# Patient Record
Sex: Female | Born: 1945 | Race: White | Hispanic: No | Marital: Married | Smoking: Never smoker
Health system: Southern US, Community
[De-identification: ages and names within clinical notes are randomized; demographics above are authoritative.]

## PROBLEM LIST (undated history)

## (undated) DIAGNOSIS — K269 Duodenal ulcer, unspecified as acute or chronic, without hemorrhage or perforation: Secondary | ICD-10-CM

## (undated) DIAGNOSIS — M533 Sacrococcygeal disorders, not elsewhere classified: Secondary | ICD-10-CM

## (undated) DIAGNOSIS — E7212 Methylenetetrahydrofolate reductase deficiency: Secondary | ICD-10-CM

## (undated) DIAGNOSIS — B9681 Helicobacter pylori [H. pylori] as the cause of diseases classified elsewhere: Secondary | ICD-10-CM

## (undated) DIAGNOSIS — M5481 Occipital neuralgia: Secondary | ICD-10-CM

## (undated) DIAGNOSIS — K279 Peptic ulcer, site unspecified, unspecified as acute or chronic, without hemorrhage or perforation: Secondary | ICD-10-CM

## (undated) DIAGNOSIS — Z1589 Genetic susceptibility to other disease: Secondary | ICD-10-CM

## (undated) DIAGNOSIS — G894 Chronic pain syndrome: Secondary | ICD-10-CM

## (undated) DIAGNOSIS — K219 Gastro-esophageal reflux disease without esophagitis: Secondary | ICD-10-CM

## (undated) DIAGNOSIS — G629 Polyneuropathy, unspecified: Secondary | ICD-10-CM

## (undated) DIAGNOSIS — Q782 Osteopetrosis: Secondary | ICD-10-CM

## (undated) DIAGNOSIS — G4700 Insomnia, unspecified: Secondary | ICD-10-CM

## (undated) HISTORY — DX: Insomnia, unspecified: G47.00

## (undated) HISTORY — DX: Sacrococcygeal disorders, not elsewhere classified: M53.3

## (undated) HISTORY — PX: OTHER SURGICAL HISTORY: SHX169

## (undated) HISTORY — DX: Genetic susceptibility to other disease: Z15.89

## (undated) HISTORY — PX: CAUTERIZE INNER NOSE: SHX279

## (undated) HISTORY — DX: Duodenal ulcer, unspecified as acute or chronic, without hemorrhage or perforation: K26.9

## (undated) HISTORY — DX: Methylenetetrahydrofolate reductase deficiency: E72.12

## (undated) HISTORY — DX: Chronic pain syndrome: G89.4

## (undated) HISTORY — DX: Occipital neuralgia: M54.81

## (undated) HISTORY — DX: Osteopetrosis: Q78.2

## (undated) HISTORY — DX: Gastro-esophageal reflux disease without esophagitis: K21.9

---

## 1985-02-23 HISTORY — PX: KNEE SURGERY: SHX244

## 2010-05-20 ENCOUNTER — Inpatient Hospital Stay (INDEPENDENT_AMBULATORY_CARE_PROVIDER_SITE_OTHER)
Admission: RE | Admit: 2010-05-20 | Discharge: 2010-05-20 | Disposition: A | Discharge status: Home / Self Care | Payer: 59 | Source: Ambulatory Visit | Attending: Family Medicine | Admitting: Family Medicine

## 2010-05-20 ENCOUNTER — Encounter: Payer: Self-pay | Admitting: Family Medicine

## 2010-05-20 DIAGNOSIS — R51 Headache: Secondary | ICD-10-CM

## 2010-05-20 DIAGNOSIS — R519 Headache, unspecified: Secondary | ICD-10-CM | POA: Insufficient documentation

## 2010-05-21 ENCOUNTER — Telehealth (INDEPENDENT_AMBULATORY_CARE_PROVIDER_SITE_OTHER): Payer: Self-pay | Admitting: *Deleted

## 2010-05-22 ENCOUNTER — Encounter: Payer: Self-pay | Admitting: Family Medicine

## 2010-05-26 ENCOUNTER — Telehealth (INDEPENDENT_AMBULATORY_CARE_PROVIDER_SITE_OTHER): Payer: Self-pay | Admitting: *Deleted

## 2010-05-27 NOTE — Assessment & Plan Note (Signed)
Summary: PAIN IN LEFT EAR/SWOLLEN CHEEK rm 4   Vital Signs:  Patient Profile:   65 Years Old Female CC:      LT ear pain Height:     64.75 inches Weight:      137 pounds O2 Sat:      97 % O2 treatment:    Room Air Temp:     98.2 degrees F oral Pulse rate:   78 / minute Resp:     16 per minute BP sitting:   115 / 73  (left arm) Cuff size:   large  Vitals Entered By: Clemens Catholic LPN (May 20, 2010 4:05 PM)                  Updated Prior Medication List: ZOLPIDEM TARTRATE 10 MG TABS (ZOLPIDEM TARTRATE)  NUCYNTA 50 MG TABS (TAPENTADOL HCL)   Current Allergies: ! * DUST AND POLLENHistory of Present Illness Chief Complaint: LT ear pain History of Present Illness:  Subjective:  Patient complains of complains of mild swelling and pain in her left cheek and jaw area for 2 days.   She had a similar problem in July, 2011, after having a root canal on the left lower jaw.  She was treated with several rounds of amoxicillin and swelling at that time finally resolved.  Since then she has had recurring episodes of pain in the left jaw and ear area, but the pain does not go into her left temporal area.  The pain is somewhat worse at night.  Mild sinus congestion.  She had a CT scan of her head in September, 2011, that was negative.  She has been evaluated by ENT and dentist recently who do not feel that she has TMJ pain.  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  REVIEW OF SYSTEMS Constitutional Symptoms      Denies fever, chills, night sweats, weight loss, weight gain, and fatigue.  Eyes       Denies change in vision, eye pain, eye discharge, glasses, contact lenses, and eye surgery. Ear/Nose/Throat/Mouth       Complains of ear pain and sinus problems.      Denies hearing loss/aids, change in hearing, ear discharge, dizziness, frequent runny nose, frequent nose bleeds, sore throat, hoarseness, and tooth pain or  bleeding.  Respiratory       Denies dry cough, productive cough, wheezing, shortness of breath, asthma, bronchitis, and emphysema/COPD.  Cardiovascular       Denies murmurs, chest pain, and tires easily with exhertion.    Gastrointestinal       Denies stomach pain, nausea/vomiting, diarrhea, constipation, blood in bowel movements, and indigestion. Genitourniary       Denies painful urination, kidney stones, and loss of urinary control. Neurological       Denies paralysis, seizures, and fainting/blackouts. Musculoskeletal       Denies muscle pain, joint pain, joint stiffness, decreased range of motion, redness, swelling, muscle weakness, and gout.  Skin       Denies bruising, unusual mles/lumps or sores, and hair/skin or nail changes.  Psych       Denies mood changes, temper/anger issues, anxiety/stress, speech problems, depression, and sleep problems. Other Comments: pt c/o LT ear pain and swelling on the LT side of her face x . she states that she had a root canal in july 2011 and has had sinus problems since then. she was  treated with 2 mths of ABT then had a CT head, negative per pt for tumor, sinuses clear. the pain is worse at night. she has tried heat packs with no help. she states that her dentist checked her for TMJ and that was negative as well. 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  Extraocular movement is intact.  Conjunctivae are not inflamed.  Ears:  Canals  normal.  Tympanic membranes normal.  No TMJ tenderness Nose:  Mildly congested.  Mild tenderness with deep palpation over the left maxillary sinus. Face:  No obvious swelling of left face.  No erythema or warmth.  Left cheek slightly tender to palpation Mouth:  No lesions.  Teeth in good repair.  No gingival tenderness Pharynx:  Normal  Neck:  Supple.  No adenopathy is present.  No thyromegaly is present  Lungs:  Clear to auscultation.  Breath sounds are equal.  Heart:  Regular rate and rhythm without murmurs, rubs, or gallops.  Skin:  No rash Neuroologic:  Cranial nerves intact CBC:  WBC 5.9; 39.5 LY, 2.4 MO, 58.1 GR; Hgb 14.6 Assessment New Problems: FACIAL PAIN (ICD-784.0)  LEFT FACIAL PAIN ? ETIOLOGY.  ? SINUSITIS.  NO TENDERNESS OVER TMJ OR TEMPORAL ARTERY ? NEUROPATHIC PAIN  Plan New Medications/Changes: VOLTAREN 1 % GEL (DICLOFENAC SODIUM) Apply one gm to affected area two times a day to three times a day  #100gm x 0, 05/20/2010, Donna Christen MD AMOXICILLIN 875 MG TABS (AMOXICILLIN) One by mouth two times a day  #20 x 0, 05/20/2010, Donna Christen MD  New Orders: New Patient Level IV [99204] CBC w/Diff [16109-60454] T-Sed Rate (Automated) [09811-91478] Planning Comments:   Will check sed rate.  Begin empiric amoxicillin for possible sinusitis.  Trial of Voltaren gel (patient declines prednisone) If symptoms do not improve, suggest neurologic evaluation.   The patient and/or caregiver has been counseled thoroughly with regard to medications prescribed including dosage, schedule, interactions, rationale for use, and possible side effects and they verbalize understanding.  Diagnoses and expected course of recovery discussed and will return if not improved as expected or if the condition worsens. Patient and/or caregiver verbalized understanding.  Prescriptions: VOLTAREN 1 % GEL (DICLOFENAC SODIUM) Apply one gm to affected area two times a day to three times a day  #100gm x 0    Entered and Authorized by:   Donna Christen MD   Signed by:   Donna Christen MD on 05/20/2010   Method used:   Print then Give to Patient   RxID:   [id number] AMOXICILLIN 875 MG TABS (AMOXICILLIN) One by mouth two times a day  #20 x 0   Entered and Authorized by:   Donna Christen MD   Signed by:   Donna Christen MD on 05/20/2010   Method used:   Print then Give to Patient   RxID:   [id number]   Orders Added: 1)  New Patient Level IV [72536] 2)  CBC w/Diff [64403-47425]  3)  T-Sed Rate (Automated) [16109-60454]

## 2010-05-27 NOTE — Progress Notes (Signed)
  Phone Note Call from Patient   Caller: Patient Call For: medication reaction Summary of Call: pt called and states that she took Amoxicillin last night about 6:30pm and then she took her other 2 meds about 2 hours later. During the night she felt SOB and like her skin was "crawling". she has taken amoxicillin in the past with no problems. she has taken keflex and zpak in the past with no problems. advised her not to take anymore until we called her back. she uses Adult nurse in [place]. pts phone # [phone]. 05/21/10 10:30AM - per Dr Orson Aloe have the pt to take Claritin and Sudafed, stop Amoxicillin. call back on friday if her sinus s/s are not improving or worsen and dr Orson Aloe will call in Zpak.pt agrees/ C.Vidalia Serpas,LPN Initial call taken by: Clemens Catholic LPN,  May 21, 2010 10:09 AM  Follow-up for Phone Call        Stop Amoxicillin.  Use OTC Zytec-D for a few days, hydration, nasal saline.  Her ESR is normal.  If not improving in a few days, she can call back and we will call in a Zpak for her... vs seeing an ENT. Follow-up by: Hoyt Koch MD,  May 21, 2010 10:31 AM

## 2010-06-02 ENCOUNTER — Telehealth (INDEPENDENT_AMBULATORY_CARE_PROVIDER_SITE_OTHER): Payer: Self-pay | Admitting: *Deleted

## 2010-06-14 ENCOUNTER — Inpatient Hospital Stay (INDEPENDENT_AMBULATORY_CARE_PROVIDER_SITE_OTHER)
Admission: RE | Admit: 2010-06-14 | Discharge: 2010-06-14 | Disposition: A | Discharge status: Home / Self Care | Payer: 59 | Source: Ambulatory Visit | Attending: Emergency Medicine | Admitting: Emergency Medicine

## 2010-06-14 ENCOUNTER — Encounter: Payer: Self-pay | Admitting: Emergency Medicine

## 2010-06-14 DIAGNOSIS — R1013 Epigastric pain: Secondary | ICD-10-CM | POA: Insufficient documentation

## 2010-06-18 ENCOUNTER — Telehealth (INDEPENDENT_AMBULATORY_CARE_PROVIDER_SITE_OTHER): Payer: Self-pay | Admitting: *Deleted

## 2010-07-06 ENCOUNTER — Encounter: Payer: Self-pay | Admitting: Family Medicine

## 2010-07-10 ENCOUNTER — Ambulatory Visit: Payer: 59 | Admitting: Family Medicine

## 2011-01-26 NOTE — Progress Notes (Signed)
Summary: STOMACH ACHE, BURNING IN STOMACH UP TO THROAT,CHEST PAIN, LT(rm5   Vital Signs:  Patient Profile:   65 Years Old Female CC:      Abdominal Pain O2 treatment:    Room Air (right arm) Cuff size:   regular  Vitals Entered By: Linton Flemings RN (June 14, 2010 1:19 PM)                  Updated Prior Medication List: ZOLPIDEM TARTRATE 10 MG TABS (ZOLPIDEM TARTRATE)  NUCYNTA 50 MG TABS (TAPENTADOL HCL)  AMOXICILLIN 875 MG TABS (AMOXICILLIN) One by mouth two times a day VOLTAREN 1 % GEL (DICLOFENAC SODIUM) Apply one gm to affected area two times a day to three times a day  Current Allergies: ! * DUST AND POLLEN ! SULFAHistory of Present Illness History from: patient and husband Chief Complaint: Abdominal Pain History of Present Illness: Epigastric pain, "feels like burning and esophageal spasm" in character, intermittently for 10 days. She feels it was triggered by eating pineapple and drinking coffee. Buttemilk helped a little. PeptoBismal helped.  3 days ago, tarted otc Prilosec, 20 mg daily, and she feels that is helping some. Pain started as 10/10 three days ago, now is 4/10. Denies actual chest pain, dyspnea, n , vom, or change of bowel habits.  Symptoms Trauma: Denies Diaphoresis: No Shortness of breath: No Cough: No Edema: No Orthopnea: No Syncope: No + Indigestion. Rarely feeling it in r and l shoulder. No radiation to arms. No numbness or tingling. Symptoms NOT Worse with exertion:   NoTearing/radiation to back:     REVIEW OF SYSTEMS Constitutional Symptoms      Denies fever, chills, night sweats, weight loss, weight gain, and fatigue.  Eyes       Denies change in vision, eye pain, eye discharge, glasses, contact lenses, and eye surgery. Ear/Nose/Throat/Mouth       Denies hearing loss/aids, change in hearing, ear pain, ear discharge, dizziness, frequent runny nose, frequent nose bleeds, sinus problems, sore throat, hoarseness, and tooth pain or bleeding.   Respiratory       Denies dry cough, productive cough, wheezing, shortness of breath, asthma, bronchitis, and emphysema/COPD.  Cardiovascular       Complains of chest pain.      Denies murmurs and tires easily with exhertion.    Gastrointestinal       Complains of stomach pain.      Denies nausea/vomiting, diarrhea, constipation, blood in bowel movements, and indigestion. Genitourniary       Denies painful urination, kidney stones, and loss of urinary control. Neurological       Denies paralysis, seizures, and fainting/blackouts. Musculoskeletal       Denies muscle pain, joint pain, joint stiffness, decreased range of motion, redness, swelling, muscle weakness, and gout.  Skin       Denies bruising, unusual mles/lumps or sores, and hair/skin or nail changes.  Psych       Denies mood changes, temper/anger issues, anxiety/stress, speech problems, depression, and sleep problems. Other Comments: states two days ago started with epigastric abd pain which radiates up to chest, left shoulder pain and back pain.  states pain comes and goes and she has alot of buring in abdomen.  denies SHOB, dizziness, blurred.    Past History:  Past Medical History: Reviewed history from 05/20/2010 and no changes required. 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 tail bone removed  1965  Family History: Reviewed history from 05/20/2010 and no changes required. mother deceased age 39 father deceased age 104- throat CA, V-tach  Social History: Reviewed history from 05/20/2010 and no changes required. Never Smoked Alcohol use-yes wine occasionally Drug use-no Physical Exam General appearance: Pleasant,well developed, well nourished, no acute distress. Mildly anxious. Head: normocephalic, atraumatic Eyes: conjunctivae and lids normal Pupils: equal, round, reactive to light Ears: normal, no lesions or  deformities Nasal: mucosa pink, nonedematous, no septal deviation, turbinates normal Oral/Pharynx: tongue normal, posterior pharynx without erythema or exudate Neck: neck supple,  trachea midline, no masses. Nontender. Chest/Lungs: no rales, wheezes, or rhonchi bilateral, breath sounds equal without effort. Minimal sternal ttp. Heart: regular rate and  rhythm, no murmur Abdomen: positive mild  epigastric tenderness, abdomen soft without obvious organomegaly. No other ttp. No masses, guarding, or rebound. Extremities: normal extremities. No cce. No calf ttp. Neg Homans sign. Neurological: grossly intact and non-focal Skin: no obvious rashes or lesions EKG today: NSR. WNL. Assessment New Problems: ABDOMINAL PAIN, EPIGASTRIC (ICD-789.06)  Likely gastritis and/or esoph.reflux and spasm. EKG today wnl.  No evidence of any acute cardioresp event.  Patient Education: Demonstrates willingness to comply.  Plan New Orders: Est. Patient Level IV [16109] EKG w/ Interpretation [93000] Services provided After hours-Weekends-Holidays [99051] Planning Comments:   Discussed at length with pt and husband. They decline any other w/u or referral. Tx with increased dose of Prilosec to 40 mg q day for 10 days. Anti-reflux and dietary measures discussed at length.Risks, benefits, alternatives discussed. Pt voiced understanding and agreement. ---Pt had numerous other issues, and we discussed briefly, but I advised she f/u with her general internist, who is her PCP. If any severe, worsening, or new sxs, go to ER stat. Pt and husband voiced understanding and agreement.  Follow Up: Follow up with Primary Physician  The patient and/or caregiver has been counseled thoroughly with regard to medications prescribed including dosage, schedule, interactions, rationale for use, and possible side effects and they verbalize understanding.  Diagnoses and expected course of recovery discussed and will return if not improved  as expected or if the condition worsens. Patient and/or caregiver verbalized understanding.   Orders Added: 1)  Est. Patient Level IV [60454] 2)  EKG w/ Interpretation [93000] 3)  Services provided After hours-Weekends-Holidays [99051]

## 2011-01-26 NOTE — Telephone Encounter (Signed)
  Phone Note Call from Patient   Caller: Patient Call For: rx Summary of Call: pt called and states that she has been taking Claritin and she has not improved. per dr Orson Aloe ok to call in Zpak take as directed #1/ 0 refills to [facility] pharmacy. left message for pt that the Zpak had been called in and if she fails to improve after taking it she will need to F/U with an ENT. Initial call taken by: Clemens Catholic LPN,  May 26, 2010 11:12 AM

## 2011-01-26 NOTE — Telephone Encounter (Signed)
  Phone Note Outgoing Call   Call placed by: Clemens Catholic LPN,  June 18, 2010 9:10 AM Call placed to: Patient Summary of Call: call back: pt states that she is feeling much better. advised her to call back with any questions or concerns. Initial call taken by: Clemens Catholic LPN,  June 18, 2010 9:10 AM

## 2011-01-26 NOTE — Telephone Encounter (Signed)
  Phone Note Call from Patient   Caller: Patient Summary of Call: pt called and states that she is some better, she still has jaw pain. advised her to follow up with her ENT and to sch appt with a PCP. pt agrees. Initial call taken by: Clemens Catholic LPN,  June 02, 2010 11:05 AM

## 2015-02-24 DIAGNOSIS — B9681 Helicobacter pylori [H. pylori] as the cause of diseases classified elsewhere: Secondary | ICD-10-CM

## 2015-02-24 DIAGNOSIS — K279 Peptic ulcer, site unspecified, unspecified as acute or chronic, without hemorrhage or perforation: Secondary | ICD-10-CM

## 2015-02-24 HISTORY — DX: Helicobacter pylori (H. pylori) as the cause of diseases classified elsewhere: B96.81

## 2015-02-24 HISTORY — DX: Peptic ulcer, site unspecified, unspecified as acute or chronic, without hemorrhage or perforation: K27.9

## 2015-11-06 ENCOUNTER — Ambulatory Visit (INDEPENDENT_AMBULATORY_CARE_PROVIDER_SITE_OTHER): Payer: Medicare HMO | Admitting: Family Medicine

## 2015-11-06 ENCOUNTER — Encounter: Payer: Self-pay | Admitting: Family Medicine

## 2015-11-06 VITALS — BP 127/79 | HR 72 | Wt 142.0 lb

## 2015-11-06 DIAGNOSIS — M5481 Occipital neuralgia: Secondary | ICD-10-CM | POA: Insufficient documentation

## 2015-11-06 DIAGNOSIS — M720 Palmar fascial fibromatosis [Dupuytren]: Secondary | ICD-10-CM

## 2015-11-06 MED ORDER — AMBULATORY NON FORMULARY MEDICATION: 0 refills | Status: DC

## 2015-11-06 NOTE — Progress Notes (Signed)
Gina Mccall is a 70 y.o. female who presents to [facility] today for right hand nodule and neck pain.  Patient has a hand nodule on her right hand present for months. She notes on the palmar aspect of the ulnar side of her hand she has a nodule that has contracted the palm side of her hand. She is able to extend her hand fully denies any lack of grip strength. She does note some pain with hand motion. No radiating pain weakness or numbness. She denies any injury. She has not had any formal treatment yet.  Additionally she notes continued neck pain. She has a history of occipital neuralgia and has received some massage therapy in the past. She is interested in formal physical therapy.   Past Medical History:  Diagnosis Date  . 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 Tail bone pain tail bone removed 1965    Past Surgical History:  Procedure Laterality Date  . tail bone  removed 1965   Social History  Substance Use Topics  . Smoking status: Never Smoker  . Smokeless tobacco: Not on file  . Alcohol use Yes     Comment: occasional glass of wine   family history includes Cancer in her father; Heart disease in her father.  ROS:  No headache, visual changes, nausea, vomiting, diarrhea, constipation, dizziness, abdominal pain, skin rash, fevers, chills, night sweats, weight loss, swollen lymph nodes, body aches, joint swelling, muscle aches, chest pain, shortness of breath, mood changes, visual or auditory hallucinations.    Medications: Current Outpatient Prescriptions  Medication Sig Dispense Refill  . amoxicillin (AMOXIL) 875 MG tablet Take 875 mg by mouth 2 (two) times daily.      . Calcium-Magnesium-Vitamin D 500-250-200 MG-MG-UNIT TABS Take by mouth.    . Cholecalciferol (VITAMIN D3) 5000 units TABS Take by mouth.    . clonazePAM (KLONOPIN) 0.5 MG tablet TAKE 1 TABLET (0.5 MG TOTAL) BY MOUTH AT BEDTIME AS NEEDED FOR ANXIETY.    .  clonazePAM (KLONOPIN) 0.5 MG tablet TAKE 1.5  TABLET (0.75 MG TOTAL) BY MOUTH AT BEDTIME AS NEEDED FOR ANXIETY.    . diclofenac sodium (VOLTAREN) 1 % GEL Apply 1 application topically. Twice daily     . estradiol (CLIMARA - DOSED IN MG/24 HR) 0.05 mg/24hr patch     . L-Methylfolate-Algae-B12-B6 (METANX PO) Take by mouth.    . Omega-3 Fatty Acids (FISH OIL) 1000 MG CAPS Take by mouth.    . Probiotic Product (ACIDOPHILUS/GOAT MILK) CAPS Take by mouth.    . tapentadol (NUCYNTA) 50 MG TABS Take 50 mg by mouth.      . vitamin E 100 UNIT capsule Take by mouth.    . zolpidem (AMBIEN) 10 MG tablet Take 10 mg by mouth.      . AMBULATORY NON FORMULARY MEDICATION Massage therapy for occipital neuralgia. Use as needed. 1 each 0   No current facility-administered medications for this visit.    Allergies  Allergen Reactions  . Sulfonamide Derivatives      Exam:  BP 127/79   Pulse 72   Wt 142 lb (64.4 kg)   BMI 23.81 kg/m  General: Well Developed, well nourished, and in no acute distress.  Neuro/Psych: Alert and oriented x3, extra-ocular muscles intact, able to move all 4 extremities, sensation grossly intact. Skin: Warm and dry, no rashes noted.  Respiratory: Not using accessory muscles, speaking in full sentences, trachea midline.  Cardiovascular: Pulses palpable, no extremity  edema. Abdomen: Does not appear distended. MSK: Right hand: Superficial flexion overlying the palmar aspect of the fourth metacarpal. No fifth metacarpal involvement. Appearance is consistent with Dupuytren's contracture. Neck: Nontender normal neck motion.   No results found for this or any previous visit (from the past 24 hour(s)). No results found.   Assessment and Plan: 70 y.o. female with  Right hand: Dupuytren's contracture. Discussed options. Patient elects for trial of hand therapy.  Occipital neuralgia: Trial of formal physical therapy and massage therapy. Return as needed.   Discussed warning signs  or symptoms. Please see discharge instructions. Patient expresses understanding.

## 2015-11-06 NOTE — Patient Instructions (Signed)
Thank you for coming in today. Do the stretching and eccentric exercises for the hand.  Attend PT for the neck.  Return as needed.    Dupuytren's Contracture Dupuytren's contracture affects the fingers and the palm of the hand. This condition usually develops slowly. It may take many years to develop. The pinky finger and the ring finger are most often affected. These fingers start to curve inward, like a claw. At some point, the fingers cannot go straight anymore. This can make it hard to do things like:  Put on gloves.  Shake hands.  Grab something off a shelf. The condition usually does not cause pain and is not dangerous. The condition gets its name from the doctor who came up with an operation to fix the problem. His name was Parke PoissonBaron Guillaume Dupuytren. Contracture means pulling inward. CAUSES  Dupuytren's contracture does not start with the fingers. It starts in the palm of the hand, under the skin. The tissue under the skin is called fascia. The fascia covers the cords (tendons) that control how the fingers move. In Dupuytren's contracture the fascia tissue becomes thick and then pulls on the cords. That causes the fingers to curl. The condition can affect both hands and any fingers, but it usually strikes one hand worse than the other. The fingers farthest from the thumb are most often the ones that curl. The cause is not clear. Some experts believe it results from an autoimmune reaction. That means the body's immune system (which fights off disease) attacks itself by mistake. What experts do know is that certain conditions and behaviors (called risk factors) make the chance of having this condition more likely. They include:  Age. Most people who have the condition are older than 50.  Sex. It affects men more often than women.  Family history. The condition tends to run in families from countries in [place] and [place].  Certain behaviors. People who smoke and drink  alcohol are more apt to develop the problem.  Some other medical conditions. Having diabetes makes Dupuytren's contracture more likely. So does having a condition that involves a seizure (when the brain's function is interrupted). SYMPTOMS  Signs of this condition take time to develop. Sometimes this takes weeks or months. More often, it takes several years.   Early symptoms:  Skin on the palm of the hand becomes thick. This is usually the first sign.  The skin may look dimpled or puckered.  Lumps (nodules) show up on the palm. There may be one or more lumps. They are not painful.  Later symptoms:  Thick cords of tissue form in the palm of the hand.  The pinky and ring fingers start to curl up into the palm.  The fingers cannot be straightened into their normal position. DIAGNOSIS  A physical examination is the main way that a healthcare provider can tell if you have Dupuytren's contracture. Other tests usually are not needed. The caregiver will probably:  Look at your hands. Feel your hands. This is to check for thickening and nodules.  Measure finger motion. This tells how much your fingers have contracted (pulled in).  Do a tabletop test. You will be asked to try to put your hand flat on a table, palm down. TREATMENT  There is no cure for Dupuytren's contracture. But there are ways to treat the symptoms. Options include:  Watching and waiting. The condition develops slowly. Often it does not create problems for a long time. Sometimes the skin gets thick  and nodules form, but the fingers never curl. So, in some cases it is best to just watch the condition carefully and wait to see what happens.  Shots (injections). Different substances may be injected, including:  Steroids. These drugs block swelling. These shots should make the condition less uncomfortable. Steroids may also slow down the condition. Shots are given into the nodules. The effect only lasts awhile. More shots may  have to be given.  Enzymes. These are proteins. They weaken the thick tissue. After an injection, the caregiver usually stretches the fingers.  Needling. A needle is pushed through the skin and into the thick tissue. This is done in several spots. The goal is to break up the thickened tissue. Or to weaken it.  Surgery. This may be suggested if you cannot grasp objects. Or, if you can no longer put your hand in your pocket.  A cut (incision) is made in the palm of the hand. The thick tissue is removed.  Sometimes the thick tissue is attached to the skin. Then, the skin must be removed, too. It is replaced with a piece of skin from another place on your body. That is called a skin graft.  Occupational or hand therapy is almost always needed after surgery. This involves special exercises to get back the use of your hand and fingers. After a skin graft, several months of therapy may be needed.  Sometimes the condition comes back, even after surgery.  Other methods. You can do some things on your own. They include:  Stretching the fingers backwards. Do this often.  Warming the hand and massaging it. Again, do this often.  Using tools with padded grips. This should make things easier.  Wearing heavy gloves while working. This protects the hands. PROGNOSIS  Dupuytren's contracture usually develops slowly. There is no cure. But, the symptoms can be treated. Sometimes they come back after treatment, but not always. It is important to remember that this is a functional problem and not a life-threatening condition.   This information is not intended to replace advice given to you by your health care provider. Make sure you discuss any questions you have with your health care provider.   Document Released: 12/07/2008 Document Revised: 03/02/2014 Document Reviewed: 12/07/2008 Elsevier Interactive Patient Education [employer]

## 2017-03-18 ENCOUNTER — Ambulatory Visit (INDEPENDENT_AMBULATORY_CARE_PROVIDER_SITE_OTHER): Payer: Medicare HMO | Admitting: Obstetrics & Gynecology

## 2017-03-18 ENCOUNTER — Ambulatory Visit: Payer: Medicare HMO | Admitting: Obstetrics & Gynecology

## 2017-03-18 ENCOUNTER — Encounter: Payer: Self-pay | Admitting: Obstetrics & Gynecology

## 2017-03-18 VITALS — BP 114/74 | HR 70 | Ht 65.0 in | Wt 132.0 lb

## 2017-03-18 DIAGNOSIS — Z01419 Encounter for gynecological examination (general) (routine) without abnormal findings: Secondary | ICD-10-CM

## 2017-03-18 MED ORDER — ALENDRONATE SODIUM 70 MG PO TABS: 70.0000 mg | ORAL_TABLET | ORAL | 12 refills | Status: DC

## 2017-03-18 MED ORDER — ESTRADIOL 0.025 MG/24HR TD PTTW: MEDICATED_PATCH | TRANSDERMAL | 12 refills | Status: DC

## 2017-03-18 MED ORDER — PROGESTERONE MICRONIZED POWD: 12 refills | Status: DC

## 2017-03-18 MED ORDER — ESTRADIOL 2 MG VA RING: 2.0000 mg | VAGINAL_RING | VAGINAL | 12 refills | Status: DC

## 2017-03-18 NOTE — Progress Notes (Signed)
Subjective:    Seward MethSuzanne Baltzell is a 72 y.o. married White P3 female who presents for an annual exam. The patient has no complaints today. She needs refills of her HRT. She does not want to stop this.  The patient is sexually active. GYN screening history: last pap: was normal. The patient wears seatbelts: yes. The patient participates in regular exercise: yes. Has the patient ever been transfused or tattooed?: no. The patient reports that there is not domestic violence in her life.   Menstrual History: OB History    Gravida Para Term Preterm AB Living   4 3 3   1 3    SAB TAB Ectopic Multiple Live Births   1              Menarche age: [age] No LMP recorded. Patient is postmenopausal.    The following portions of the patient's history were reviewed and updated as appropriate: allergies, current medications, past family history, past medical history, past social history, past surgical history and problem list.  Review of Systems Pertinent items are noted in HPI.   FH- no breast/gyn/colon cancer S/p colonoscopy Works 14 hours per day, [employer] Has osteoporosis Had bad reaction to forteo    Objective:    BP 114/74   Pulse 70   Ht 5\' 5"  (1.651 m)   Wt 132 lb (59.9 kg)   BMI 21.97 kg/m   General Appearance:    Alert, cooperative, no distress, appears stated age  Head:    Normocephalic, without obvious abnormality, atraumatic  Eyes:    PERRL, conjunctiva/corneas clear, EOM's intact, fundi    benign, both eyes  Ears:    Normal TM's and external ear canals, both ears  Nose:   Nares normal, septum midline, mucosa normal, no drainage    or sinus tenderness  Throat:   Lips, mucosa, and tongue normal; teeth and gums normal  Neck:   Supple, symmetrical, trachea midline, no adenopathy;    thyroid:  no enlargement/tenderness/nodules; no carotid   bruit or JVD  Back:     Symmetric, no curvature, ROM normal, no CVA tenderness  Lungs:     Clear to auscultation bilaterally, respirations  unlabored  Chest Wall:    No tenderness or deformity   Heart:    Regular rate and rhythm, S1 and S2 normal, no murmur, rub   or gallop  Breast Exam:    No tenderness, masses, or nipple abnormality  Abdomen:     Soft, non-tender, bowel sounds active all four quadrants,    no masses, no organomegaly  Genitalia:    Normal female without lesion, discharge or tenderness, moderate atrophy, normal size and shape, anteverted, mobile, non-tender, normal adnexal exam Estring in place     Extremities:   Extremities normal, atraumatic, no cyanosis or edema  Pulses:   2+ and symmetric all extremities  Skin:   Skin color, texture, turgor normal, no rashes or lesions  Lymph nodes:   Cervical, supraclavicular, and axillary nodes normal  Neurologic:   CNII-XII intact, normal strength, sensation and reflexes    throughout  .    Assessment:    Healthy female exam.    Plan:     Mammogram.   Refill HRT Check gyn u/s to follow uterine lining Discussed Fosamax, discussed risks. She is willing to try it.

## 2017-03-24 ENCOUNTER — Ambulatory Visit (INDEPENDENT_AMBULATORY_CARE_PROVIDER_SITE_OTHER): Payer: Medicare HMO

## 2017-03-24 DIAGNOSIS — Z1231 Encounter for screening mammogram for malignant neoplasm of breast: Secondary | ICD-10-CM | POA: Diagnosis not present

## 2017-03-24 DIAGNOSIS — R9389 Abnormal findings on diagnostic imaging of other specified body structures: Secondary | ICD-10-CM

## 2017-03-24 DIAGNOSIS — Z01419 Encounter for gynecological examination (general) (routine) without abnormal findings: Secondary | ICD-10-CM

## 2017-03-25 ENCOUNTER — Telehealth: Payer: Self-pay | Admitting: *Deleted

## 2017-03-25 MED ORDER — MISOPROSTOL 200 MCG PO TABS: ORAL_TABLET | ORAL | 0 refills | Status: DC

## 2017-03-25 NOTE — Telephone Encounter (Signed)
Pt called to schedule an Endometrial biopsy.  [facility]

## 2017-03-29 ENCOUNTER — Encounter: Payer: Self-pay | Admitting: Obstetrics & Gynecology

## 2017-03-29 ENCOUNTER — Ambulatory Visit (INDEPENDENT_AMBULATORY_CARE_PROVIDER_SITE_OTHER): Payer: Medicare HMO | Admitting: Obstetrics & Gynecology

## 2017-03-29 VITALS — BP 123/77 | HR 77 | Ht 65.0 in | Wt 132.0 lb

## 2017-03-29 DIAGNOSIS — R9389 Abnormal findings on diagnostic imaging of other specified body structures: Secondary | ICD-10-CM

## 2017-03-29 NOTE — Progress Notes (Signed)
   Subjective:    Patient ID: Seward MethSuzanne Kugelman, female    DOB: 02-Nov-1945, 72 y.o.   MRN: [id number]  HPI 72 yo lady here for an embx due to the following ultrasound findings:   IMPRESSION: Abnormal thickened and heterogeneous appearance of the endometrial complex measuring 9 mm thick.  Endometrial thickness is considered abnormal for an asymptomatic post-menopausal female. Endometrial sampling should be considered to exclude carcinoma.  She takes estrogen with only a relatively small amount of progestin.  Review of Systems     Objective:   Physical Exam Well nourished, well hydrated White female, no apparent distress Breathing, conversing, and ambulating normally  UPT negative, consent signed, time out done Cervix prepped with betadine and grasped with a single tooth tenaculum Uterus sounded to 8 cm Pipelle used for 2 passes with a moderate amount of tissue obtained. A gritty sensation was appreciated. She tolerated the procedure well.     Assessment & Plan:  Thickened endometrium Await pathology

## 2017-04-09 ENCOUNTER — Encounter: Payer: Self-pay | Admitting: Obstetrics & Gynecology

## 2018-03-21 ENCOUNTER — Encounter: Payer: Self-pay | Admitting: Family Medicine

## 2018-03-21 ENCOUNTER — Ambulatory Visit (INDEPENDENT_AMBULATORY_CARE_PROVIDER_SITE_OTHER): Payer: Medicare HMO | Admitting: Family Medicine

## 2018-03-21 VITALS — BP 106/65 | HR 70 | Resp 16 | Ht 65.0 in | Wt 133.0 lb

## 2018-03-21 DIAGNOSIS — Z01419 Encounter for gynecological examination (general) (routine) without abnormal findings: Secondary | ICD-10-CM | POA: Diagnosis not present

## 2018-03-21 NOTE — Progress Notes (Signed)
  Subjective:     Gina Mccall is a 73 y.o. female and is here for a comprehensive physical exam. The patient reports no problems. On HRT and had thickened lining last year. Getting yearly screening u/s. Taking prog q 3 months. Negative EMB last year.  The following portions of the patient's history were reviewed and updated as appropriate: allergies, current medications, past family history, past medical history, past social history, past surgical history and problem list.  Review of Systems Pertinent items noted in HPI and remainder of comprehensive ROS otherwise negative.   Objective:    BP 106/65   Pulse 70   Resp 16   Ht 5\' 5"  (1.651 m)   Wt 133 lb (60.3 kg)   BMI 22.13 kg/m  General appearance: alert, cooperative and appears stated age Head: Normocephalic, without obvious abnormality, atraumatic Neck: no adenopathy, supple, symmetrical, trachea midline and thyroid not enlarged, symmetric, no tenderness/mass/nodules Lungs: clear to auscultation bilaterally Breasts: normal appearance, no masses or tenderness Heart: regular rate and rhythm, S1, S2 normal, no murmur, click, rub or gallop Abdomen: soft, non-tender; bowel sounds normal; no masses,  no organomegaly Pelvic: cervix normal in appearance, external genitalia normal, no adnexal masses or tenderness, no cervical motion tenderness, uterus normal size, shape, and consistency and vagina normal without discharge Extremities: extremities normal, atraumatic, no cyanosis or edema Pulses: 2+ and symmetric Skin: Skin color, texture, turgor normal. No rashes or lesions Lymph nodes: Cervical, supraclavicular, and axillary nodes normal. Neurologic: Grossly normal    Assessment:    Healthy female exam.      Plan:   Problem List Items Addressed This Visit    None    Visit Diagnoses    Encounter for gynecological examination without abnormal finding    -  Primary   Relevant Orders   MM DIGITAL SCREENING BILATERAL   US PELVIC  COMPLETE WITH TRANSVAGINAL     Will f/u her pelvic sono May increase her prog to q 2 months, take just prior to u/s Check mammogram Does not need pap smears Has PCP See After Visit Summary for Counseling Recommendations

## 2018-03-21 NOTE — Patient Instructions (Signed)
Preventive Care 73 Years and Older, Female Preventive care refers to lifestyle choices and visits with your health care provider that can promote health and wellness. What does preventive care include?  A yearly physical exam. This is also called an annual well check.  Dental exams once or twice a year.  Routine eye exams. Ask your health care provider how often you should have your eyes checked.  Personal lifestyle choices, including: ? Daily care of your teeth and gums. ? Regular physical activity. ? Eating a healthy diet. ? Avoiding tobacco and drug use. ? Limiting alcohol use. ? Practicing safe sex. ? Taking low-dose aspirin every day. ? Taking vitamin and mineral supplements as recommended by your health care provider. What happens during an annual well check? The services and screenings done by your health care provider during your annual well check will depend on your age, overall health, lifestyle risk factors, and family history of disease. Counseling Your health care provider may ask you questions about your:  Alcohol use.  Tobacco use.  Drug use.  Emotional well-being.  Home and relationship well-being.  Sexual activity.  Eating habits.  History of falls.  Memory and ability to understand (cognition).  Work and work Statistician.  Reproductive health.  Screening You may have the following tests or measurements:  Height, weight, and BMI.  Blood pressure.  Lipid and cholesterol levels. These may be checked every 5 years, or more frequently if you are over 30 years old.  Skin check.  Lung cancer screening. You may have this screening every year starting at age 27 if you have a 30-pack-year history of smoking and currently smoke or have quit within the past 15 years.  Colorectal cancer screening. All adults should have this screening starting at age 33 and continuing until age 46. You will have tests every 1-10 years, depending on your results and the  type of screening test. People at increased risk should start screening at an earlier age. Screening tests may include: ? Guaiac-based fecal occult blood testing. ? Fecal immunochemical test (FIT). ? Stool DNA test. ? Virtual colonoscopy. ? Sigmoidoscopy. During this test, a flexible tube with a tiny camera (sigmoidoscope) is used to examine your rectum and lower colon. The sigmoidoscope is inserted through your anus into your rectum and lower colon. ? Colonoscopy. During this test, a long, thin, flexible tube with a tiny camera (colonoscope) is used to examine your entire colon and rectum.  Hepatitis C blood test.  Hepatitis B blood test.  Sexually transmitted disease (STD) testing.  Diabetes screening. This is done by checking your blood sugar (glucose) after you have not eaten for a while (fasting). You may have this done every 1-3 years.  Bone density scan. This is done to screen for osteoporosis. You may have this done starting at age 37.  Mammogram. This may be done every 1-2 years. Talk to your health care provider about how often you should have regular mammograms. Talk with your health care provider about your test results, treatment options, and if necessary, the need for more tests. Vaccines Your health care provider may recommend certain vaccines, such as:  Influenza vaccine. This is recommended every year.  Tetanus, diphtheria, and acellular pertussis (Tdap, Td) vaccine. You may need a Td booster every 10 years.  Varicella vaccine. You may need this if you have not been vaccinated.  Zoster vaccine. You may need this after age 38.  Measles, mumps, and rubella (MMR) vaccine. You may need at least  one dose of MMR if you were born in 1957 or later. You may also need a second dose.  Pneumococcal 13-valent conjugate (PCV13) vaccine. One dose is recommended after age 24.  Pneumococcal polysaccharide (PPSV23) vaccine. One dose is recommended after age 24.  Meningococcal  vaccine. You may need this if you have certain conditions.  Hepatitis A vaccine. You may need this if you have certain conditions or if you travel or work in places where you may be exposed to hepatitis A.  Hepatitis B vaccine. You may need this if you have certain conditions or if you travel or work in places where you may be exposed to hepatitis B.  Haemophilus influenzae type b (Hib) vaccine. You may need this if you have certain conditions. Talk to your health care provider about which screenings and vaccines you need and how often you need them. This information is not intended to replace advice given to you by your health care provider. Make sure you discuss any questions you have with your health care provider. Document Released: 03/08/2015 Document Revised: 04/01/2017 Document Reviewed: 12/11/2014 Elsevier Interactive Patient Education  2019 [employer].

## 2018-04-13 ENCOUNTER — Other Ambulatory Visit: Payer: Self-pay | Admitting: Obstetrics & Gynecology

## 2018-04-13 ENCOUNTER — Ambulatory Visit (INDEPENDENT_AMBULATORY_CARE_PROVIDER_SITE_OTHER): Payer: Medicare HMO

## 2018-04-13 DIAGNOSIS — Z01419 Encounter for gynecological examination (general) (routine) without abnormal findings: Secondary | ICD-10-CM

## 2018-04-13 DIAGNOSIS — Z1231 Encounter for screening mammogram for malignant neoplasm of breast: Secondary | ICD-10-CM

## 2018-04-13 DIAGNOSIS — R9389 Abnormal findings on diagnostic imaging of other specified body structures: Secondary | ICD-10-CM | POA: Diagnosis not present

## 2018-04-14 ENCOUNTER — Telehealth: Payer: Self-pay | Admitting: Obstetrics & Gynecology

## 2018-04-14 NOTE — Telephone Encounter (Signed)
I have spoken with her. She will be getting a hysteroscopy and d&c asap.

## 2018-04-15 ENCOUNTER — Encounter (HOSPITAL_COMMUNITY): Payer: Self-pay

## 2018-04-15 ENCOUNTER — Other Ambulatory Visit: Payer: Self-pay

## 2018-04-15 ENCOUNTER — Encounter (HOSPITAL_BASED_OUTPATIENT_CLINIC_OR_DEPARTMENT_OTHER): Payer: Self-pay | Admitting: *Deleted

## 2018-04-19 ENCOUNTER — Encounter: Payer: Self-pay | Admitting: *Deleted

## 2018-04-22 ENCOUNTER — Encounter (HOSPITAL_BASED_OUTPATIENT_CLINIC_OR_DEPARTMENT_OTHER)
Admission: RE | Disposition: A | Discharge status: Home / Self Care | Payer: Self-pay | Source: Home / Self Care | Attending: Obstetrics & Gynecology

## 2018-04-22 ENCOUNTER — Encounter (HOSPITAL_BASED_OUTPATIENT_CLINIC_OR_DEPARTMENT_OTHER): Payer: Self-pay | Admitting: *Deleted

## 2018-04-22 ENCOUNTER — Ambulatory Visit (HOSPITAL_BASED_OUTPATIENT_CLINIC_OR_DEPARTMENT_OTHER): Payer: Medicare HMO | Admitting: Anesthesiology

## 2018-04-22 ENCOUNTER — Ambulatory Visit (HOSPITAL_BASED_OUTPATIENT_CLINIC_OR_DEPARTMENT_OTHER)
Admission: RE | Admit: 2018-04-22 | Discharge: 2018-04-22 | Disposition: A | Discharge status: Home / Self Care | Payer: Medicare HMO | Attending: Obstetrics & Gynecology | Admitting: Obstetrics & Gynecology

## 2018-04-22 DIAGNOSIS — G894 Chronic pain syndrome: Secondary | ICD-10-CM | POA: Diagnosis not present

## 2018-04-22 DIAGNOSIS — G4700 Insomnia, unspecified: Secondary | ICD-10-CM | POA: Diagnosis not present

## 2018-04-22 DIAGNOSIS — N9089 Other specified noninflammatory disorders of vulva and perineum: Secondary | ICD-10-CM | POA: Diagnosis present

## 2018-04-22 DIAGNOSIS — R9389 Abnormal findings on diagnostic imaging of other specified body structures: Secondary | ICD-10-CM | POA: Diagnosis present

## 2018-04-22 DIAGNOSIS — Z79890 Hormone replacement therapy (postmenopausal): Secondary | ICD-10-CM | POA: Insufficient documentation

## 2018-04-22 DIAGNOSIS — N904 Leukoplakia of vulva: Secondary | ICD-10-CM | POA: Insufficient documentation

## 2018-04-22 DIAGNOSIS — G629 Polyneuropathy, unspecified: Secondary | ICD-10-CM | POA: Diagnosis not present

## 2018-04-22 DIAGNOSIS — Z881 Allergy status to other antibiotic agents status: Secondary | ICD-10-CM | POA: Diagnosis not present

## 2018-04-22 DIAGNOSIS — Z8711 Personal history of peptic ulcer disease: Secondary | ICD-10-CM | POA: Diagnosis not present

## 2018-04-22 DIAGNOSIS — N840 Polyp of corpus uteri: Secondary | ICD-10-CM | POA: Diagnosis not present

## 2018-04-22 DIAGNOSIS — F419 Anxiety disorder, unspecified: Secondary | ICD-10-CM | POA: Insufficient documentation

## 2018-04-22 DIAGNOSIS — Z882 Allergy status to sulfonamides status: Secondary | ICD-10-CM | POA: Insufficient documentation

## 2018-04-22 DIAGNOSIS — Z79899 Other long term (current) drug therapy: Secondary | ICD-10-CM | POA: Diagnosis not present

## 2018-04-22 HISTORY — PX: HYSTEROSCOPY WITH D & C: SHX1775

## 2018-04-22 HISTORY — PX: VULVA /PERINEUM BIOPSY: SHX319

## 2018-04-22 HISTORY — DX: Peptic ulcer, site unspecified, unspecified as acute or chronic, without hemorrhage or perforation: K27.9

## 2018-04-22 HISTORY — DX: Polyneuropathy, unspecified: G62.9

## 2018-04-22 HISTORY — DX: Helicobacter pylori (H. pylori) as the cause of diseases classified elsewhere: B96.81

## 2018-04-22 LAB — BASIC METABOLIC PANEL
Anion gap: 11 (ref 5–15)
BUN: 12 mg/dL (ref 8–23)
CO2: 22 mmol/L (ref 22–32)
Calcium: 10 mg/dL (ref 8.9–10.3)
Chloride: 108 mmol/L (ref 98–111)
Creatinine, Ser: 0.75 mg/dL (ref 0.44–1.00)
GFR calc non Af Amer: >60 mL/min (ref >60)
Glucose, Bld: 88 mg/dL (ref 70–99)
Potassium: 3.7 mmol/L (ref 3.5–5.1)
Sodium: 141 mmol/L (ref 135–145)

## 2018-04-22 SURGERY — DILATATION AND CURETTAGE /HYSTEROSCOPY
Anesthesia: General | Site: Perineum

## 2018-04-22 MED ORDER — SCOPOLAMINE 1 MG/3DAYS TD PT72: 1.0000 | MEDICATED_PATCH | Freq: Once | TRANSDERMAL | Status: DC | PRN

## 2018-04-22 MED ORDER — FENTANYL CITRATE (PF) 100 MCG/2ML IJ SOLN: 25.0000 ug | INTRAMUSCULAR | Status: DC | PRN

## 2018-04-22 MED ORDER — BUPIVACAINE HCL (PF) 0.5 % IJ SOLN
INTRAMUSCULAR | Status: AC
  Filled 2018-04-22: qty 30

## 2018-04-22 MED ORDER — FENTANYL CITRATE (PF) 100 MCG/2ML IJ SOLN
50.0000 ug | INTRAMUSCULAR | Status: DC | PRN
  Administered 2018-04-22: 50 ug via INTRAVENOUS

## 2018-04-22 MED ORDER — ONDANSETRON HCL 4 MG/2ML IJ SOLN
INTRAMUSCULAR | Status: AC
  Filled 2018-04-22: qty 2

## 2018-04-22 MED ORDER — OXYCODONE HCL 5 MG/5ML PO SOLN: 5.0000 mg | Freq: Once | ORAL | Status: DC | PRN

## 2018-04-22 MED ORDER — SILVER NITRATE-POT NITRATE 75-25 % EX MISC
CUTANEOUS | Status: AC
  Filled 2018-04-22: qty 1

## 2018-04-22 MED ORDER — OXYCODONE-ACETAMINOPHEN 5-325 MG PO TABS: 1.0000 | ORAL_TABLET | ORAL | 0 refills | Status: DC | PRN

## 2018-04-22 MED ORDER — OXYCODONE HCL 5 MG PO TABS: 5.0000 mg | ORAL_TABLET | Freq: Once | ORAL | Status: DC | PRN

## 2018-04-22 MED ORDER — SODIUM CHLORIDE 0.9 % IR SOLN
Status: DC | PRN
  Administered 2018-04-22: 1

## 2018-04-22 MED ORDER — LIDOCAINE HCL (CARDIAC) PF 100 MG/5ML IV SOSY
PREFILLED_SYRINGE | INTRAVENOUS | Status: DC | PRN
  Administered 2018-04-22: 40 mg via INTRAVENOUS

## 2018-04-22 MED ORDER — DEXAMETHASONE SODIUM PHOSPHATE 10 MG/ML IJ SOLN
INTRAMUSCULAR | Status: AC
  Filled 2018-04-22: qty 1

## 2018-04-22 MED ORDER — ONDANSETRON HCL 4 MG/2ML IJ SOLN: 4.0000 mg | Freq: Once | INTRAMUSCULAR | Status: DC | PRN

## 2018-04-22 MED ORDER — SILVER NITRATE-POT NITRATE 75-25 % EX MISC
CUTANEOUS | Status: DC | PRN
  Administered 2018-04-22: 2 via TOPICAL

## 2018-04-22 MED ORDER — BUPIVACAINE HCL (PF) 0.5 % IJ SOLN
INTRAMUSCULAR | Status: DC | PRN
  Administered 2018-04-22: 30 mL

## 2018-04-22 MED ORDER — MIDAZOLAM HCL 2 MG/2ML IJ SOLN: 1.0000 mg | INTRAMUSCULAR | Status: DC | PRN

## 2018-04-22 MED ORDER — PROPOFOL 10 MG/ML IV BOLUS
INTRAVENOUS | Status: DC | PRN
  Administered 2018-04-22: 150 mg via INTRAVENOUS

## 2018-04-22 MED ORDER — FENTANYL CITRATE (PF) 100 MCG/2ML IJ SOLN
INTRAMUSCULAR | Status: AC
  Filled 2018-04-22: qty 2

## 2018-04-22 MED ORDER — LIDOCAINE 2% (20 MG/ML) 5 ML SYRINGE
INTRAMUSCULAR | Status: AC
  Filled 2018-04-22: qty 5

## 2018-04-22 MED ORDER — OXYCODONE HCL 5 MG/5ML PO SOLN: 5.0000 mg | Freq: Once | ORAL | Status: AC | PRN

## 2018-04-22 MED ORDER — LACTATED RINGERS IV SOLN
INTRAVENOUS | Status: DC
  Administered 2018-04-22: 12:00:00 via INTRAVENOUS

## 2018-04-22 MED ORDER — OXYCODONE HCL 5 MG PO TABS
5.0000 mg | ORAL_TABLET | Freq: Once | ORAL | Status: AC | PRN
  Administered 2018-04-22: 5 mg via ORAL

## 2018-04-22 MED ORDER — OXYCODONE HCL 5 MG PO TABS
ORAL_TABLET | ORAL | Status: AC
  Filled 2018-04-22: qty 1

## 2018-04-22 MED ORDER — DEXAMETHASONE SODIUM PHOSPHATE 4 MG/ML IJ SOLN
INTRAMUSCULAR | Status: DC | PRN
  Administered 2018-04-22: 5 mg via INTRAVENOUS

## 2018-04-22 SURGICAL SUPPLY — 29 items
BIPOLAR CUTTING LOOP 21FR (ELECTRODE)
BLADE SURG 15 STRL LF DISP TIS (BLADE) ×1 IMPLANT
BLADE SURG 15 STRL SS (BLADE) ×1
BRIEF STRETCH FOR OB PAD XXL (UNDERPADS AND DIAPERS) ×2 IMPLANT
CANISTER SUCT 3000ML PPV (MISCELLANEOUS) ×2 IMPLANT
DILATOR CANAL MILEX (MISCELLANEOUS) IMPLANT
ELECT REM PT RETURN 9FT ADLT (ELECTROSURGICAL) ×2
ELECTRODE REM PT RTRN 9FT ADLT (ELECTROSURGICAL) ×1 IMPLANT
GLOVE BIO SURGEON STRL SZ 6.5 (GLOVE) ×2 IMPLANT
GLOVE BIO SURGEON STRL SZ7 (GLOVE) ×2 IMPLANT
GLOVE BIOGEL PI IND STRL 7.0 (GLOVE) ×2 IMPLANT
GLOVE BIOGEL PI IND STRL 7.5 (GLOVE) ×1 IMPLANT
GLOVE BIOGEL PI INDICATOR 7.0 (GLOVE) ×2
GLOVE BIOGEL PI INDICATOR 7.5 (GLOVE) ×1
GOWN STRL REUS W/ TWL XL LVL3 (GOWN DISPOSABLE) ×1 IMPLANT
GOWN STRL REUS W/TWL LRG LVL3 (GOWN DISPOSABLE) ×2 IMPLANT
GOWN STRL REUS W/TWL XL LVL3 (GOWN DISPOSABLE) ×1
KIT PROCEDURE FLUENT (KITS) ×2 IMPLANT
LOOP CUTTING BIPOLAR 21FR (ELECTRODE) IMPLANT
NEEDLE HYPO 22GX1.5 SAFETY (NEEDLE) ×2 IMPLANT
NEEDLE SPNL 18GX3.5 QUINCKE PK (NEEDLE) ×2 IMPLANT
PACK VAGINAL MINOR WOMEN LF (CUSTOM PROCEDURE TRAY) ×2 IMPLANT
PAD OB MATERNITY 4.3X12.25 (PERSONAL CARE ITEMS) ×2 IMPLANT
PAD PREP 24X48 CUFFED NSTRL (MISCELLANEOUS) ×2 IMPLANT
PENCIL BUTTON HOLSTER BLD 10FT (ELECTRODE) ×2 IMPLANT
PUNCH BIOPSY DERMAL 3MM (MISCELLANEOUS) ×2 IMPLANT
SEAL ROD LENS SCOPE MYOSURE (ABLATOR) ×2 IMPLANT
SLEEVE SCD COMPRESS KNEE MED (MISCELLANEOUS) ×2 IMPLANT
TOWEL GREEN STERILE FF (TOWEL DISPOSABLE) ×4 IMPLANT

## 2018-04-22 NOTE — Op Note (Signed)
04/22/2018  1:13 PM  PATIENT:  Gina Mccall  73 y.o. female  PRE-OPERATIVE DIAGNOSIS:  Thickened Endometrium, vulvar lesion  POST-OPERATIVE DIAGNOSIS: same  PROCEDURE:  Procedure(s): DILATATION & CURETTAGE/HYSTEROSCOPY WITH MYOSURE (N/A)  SURGEON:  Surgeon(s) and Role:    * Mehtaab Mayeda C, MD - Primary   ANESTHESIA:   local and general  EBL:  5 mL   BLOOD ADMINISTERED:none  DRAINS: none   LOCAL MEDICATIONS USED:  MARCAINE     SPECIMEN:  Source of Specimen:  vulvar biopsy and uterine curettings and uterine polyp  DISPOSITION OF SPECIMEN:  PATHOLOGY  COUNTS:  YES  TOURNIQUET:  * No tourniquets in log *  DICTATION: .Dragon Dictation  PLAN OF CARE: Discharge to home after PACU  PATIENT DISPOSITION:  PACU - hemodynamically stable.   Delay start of Pharmacological VTE agent (>24hrs) due to surgical blood loss or risk of bleeding: not applicable    The risks, benefits, and alternatives of surgery were explained, understood, and accepted. All questions were answered. Consents were signed. In the operating room general anesthesia was applied without complication, and she was placed in the dorsal lithotomy position. Her vagina was prepped and draped in the usual sterile fashion. A bimanual exam revealed a tiny anteverted mobile uterus. Her adnexa were nonenlarged. I injected 1 cc of 1% lidocaine into the right periclitoral area and used a punch biopsy to obtain a specimen of the white area near the clitoris bilaterally. Cautery achieved hemostasis.  A speculum was placed and a single-tooth tenaculum was used to grasp the anterior lip of her cervix. A total of 30 mL of 0.5% Marcaine was used to perform a paracervical block. Her uterus sounded to 8 cm. Her cervix was carefully and slowly dilated to accommodate a small curette. Hysteroscopy was performed and a large polyp filled the endometrial cavity. I used polyp forceps to remove the polyp. I then used the hysteroscope again and a  very atrophic endometrium was noted. I did a curettage and only a tiny amount of tissue was obtained.  There was no bleeding noted at the end of the case. She was taken to the recovery room after being extubated. She tolerated the procedure well.

## 2018-04-22 NOTE — Anesthesia Procedure Notes (Signed)
Procedure Name: LMA Insertion Performed by: Karys Meckley W, CRNA Pre-anesthesia Checklist: Patient identified, Emergency Drugs available, Suction available and Patient being monitored Patient Re-evaluated:Patient Re-evaluated prior to induction Oxygen Delivery Method: Circle system utilized Preoxygenation: Pre-oxygenation with 100% oxygen Induction Type: IV induction Ventilation: Mask ventilation without difficulty LMA: LMA inserted LMA Size: 4.0 Number of attempts: 1 Placement Confirmation: positive ETCO2 Tube secured with: Tape Dental Injury: Teeth and Oropharynx as per pre-operative assessment        

## 2018-04-22 NOTE — Transfer of Care (Signed)
Immediate Anesthesia Transfer of Care Note  Patient: Gina Mccall  Procedure(s) Performed: DILATATION & CURETTAGE/HYSTEROSCOPY WITH MYOSURE (N/A Perineum) VULVAR BIOPSY (N/A Perineum)  Patient Location: PACU  Anesthesia Type:General  Level of Consciousness: awake and sedated  Airway & Oxygen Therapy: Patient Spontanous Breathing and Patient connected to face mask oxygen  Post-op Assessment: Report given to RN and Post -op Vital signs reviewed and stable  Post vital signs: Reviewed and stable  Last Vitals:  Vitals Value Taken Time  BP    Temp    Pulse    Resp    SpO2      Last Pain:  Vitals:   04/22/18 1405  TempSrc: Oral  PainSc: 5       Patients Stated Pain Goal: 3 (04/22/18 1405)  Complications: No apparent anesthesia complications

## 2018-04-22 NOTE — Anesthesia Postprocedure Evaluation (Signed)
Anesthesia Post Note  Patient: Jerilyn Scire  Procedure(s) Performed: DILATATION & CURETTAGE/HYSTEROSCOPY (N/A Perineum) VULVAR BIOPSY (N/A Perineum)     Patient location during evaluation: PACU Anesthesia Type: General Level of consciousness: awake and alert Pain management: pain level controlled Vital Signs Assessment: post-procedure vital signs reviewed and stable Respiratory status: spontaneous breathing, nonlabored ventilation, respiratory function stable and patient connected to nasal cannula oxygen Cardiovascular status: blood pressure returned to baseline and stable Postop Assessment: no apparent nausea or vomiting Anesthetic complications: no    Last Vitals:  Vitals:   04/22/18 1345 04/22/18 1405  BP: 132/73 (!) 135/93  Pulse: 70 75  Resp: 10 12  Temp:  36.6 C  SpO2: 100% 100%    Last Pain:  Vitals:   04/22/18 1405  TempSrc: Oral  PainSc: 5                  Jessamine Barcia COKER

## 2018-04-22 NOTE — Anesthesia Preprocedure Evaluation (Addendum)
Anesthesia Evaluation  Patient identified by MRN, date of birth, ID band Patient awake    Reviewed: Allergy & Precautions, NPO status , Patient's Chart, lab work & pertinent test results  History of Anesthesia Complications Negative for: history of anesthetic complications  Airway Mallampati: II  TM Distance: >3 FB Neck ROM: Full    Dental  (+) Teeth Intact, Dental Advisory Given   Pulmonary neg pulmonary ROS,    breath sounds clear to auscultation       Cardiovascular negative cardio ROS   Rhythm:Regular Rate:Normal     Neuro/Psych  Headaches,  Neuromuscular disease (occipital neuralgia) negative psych ROS   GI/Hepatic Neg liver ROS, PUD, GERD  ,  Endo/Other  negative endocrine ROS  Renal/GU negative Renal ROS     Musculoskeletal negative musculoskeletal ROS (+)   Abdominal   Peds  Hematology negative hematology ROS (+)   Anesthesia Other Findings Chronic pain syndrome   Reproductive/Obstetrics                            Anesthesia Physical Anesthesia Plan  ASA: II  Anesthesia Plan: General   Post-op Pain Management:    Induction: Intravenous  PONV Risk Score and Plan: 3 and Treatment may vary due to age or medical condition, Ondansetron and Dexamethasone  Airway Management Planned: LMA  Additional Equipment: None  Intra-op Plan:   Post-operative Plan: Extubation in OR  Informed Consent: I have reviewed the patients History and Physical, chart, labs and discussed the procedure including the risks, benefits and alternatives for the proposed anesthesia with the patient or authorized representative who has indicated his/her understanding and acceptance.     Dental advisory given  Plan Discussed with: CRNA and Anesthesiologist  Anesthesia Plan Comments:        Anesthesia Quick Evaluation

## 2018-04-22 NOTE — H&P (Signed)
Gina Mccall is an 73 y.o. married P3 here for d&c, hysteroscopy, possible myosure removal of uterine mass, and a vulvar biopsy. She uses soy and generally unopposed estrogen. She did agree to take progesterone recently after an u/s showed the following: Abnormal thickened and heterogeneous appearance of the endometrial complex measuring 9 mm thick. She had an EMBX last year and declines to have that done again. She denies PMB. She also reports a 3 year h/o a white area near her clitoris that she would like to have biopsied while she is under anesthesia.     No LMP recorded. Patient is postmenopausal.    Past Medical History:  Diagnosis Date  . Chronic pain syndrome   . Duodenal ulcer   . GERD (gastroesophageal reflux disease)   . H pylori ulcer 2017   hx of  . Insomnia   . MTHFR gene mutation (HCC)   . 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 Osteopetrosis   . Peripheral neuropathy   . Tail bone pain tail bone removed 1965     Past Surgical History:  Procedure Laterality Date  . CAUTERIZE INNER NOSE    . KNEE SURGERY Left 1987  . tail bone  removed 1965    Family History  Problem Relation Age of Onset  . Cancer Father   . Heart disease Father   . Lung cancer Paternal Uncle     Social History:  reports that she has never smoked. She has never used smokeless tobacco. She reports current alcohol use. She reports that she does not use drugs.  Allergies:  Allergies  Allergen Reactions  . Clindamycin/Lincomycin Nausea And Vomiting and Anaphylaxis  . Sulfa Antibiotics Other (See Comments) and Rash    Bruising   . Erythromycin Nausea And Vomiting  . Sulfonamide Derivatives     Medications Prior to Admission  Medication Sig Dispense Refill Last Dose  . ASHWAGANDHA PO Take by mouth.   Past Week at Unknown time  . BIOTIN PO Take by mouth.   Past Week at Unknown time  . bismuth subsalicylate (PEPTO BISMOL) 262 MG/15ML suspension Take by mouth.   Past Month at Unknown time  .  calcium elemental as carbonate (BARIATRIC TUMS ULTRA) 400 MG chewable tablet Chew by mouth.   Past Week at Unknown time  . Calcium-Magnesium-Vitamin D 500-250-200 MG-MG-UNIT TABS Take by mouth.   Past Week at Unknown time  . Cholecalciferol (VITAMIN D3) 5000 units TABS Take 50,000 Units by mouth once a week.    04/15/2018 at Unknown time  . clonazePAM (KLONOPIN) 0.5 MG tablet TAKE 1 TABLET (0.5 MG TOTAL) BY MOUTH AT BEDTIME AS NEEDED FOR ANXIETY.   04/22/2018 at 0245  . estradiol (ESTRING) 2 MG vaginal ring Place 2 mg vaginally every 3 (three) months. follow package directions 1 each 12 Past Month at Unknown time  . estradiol (VIVELLE-DOT) 0.025 MG/24HR Use as directed 8 patch 12 Past Week at Unknown time  . L-Methylfolate-Algae-B12-B6 (METANX) 3-90.314-2-35 MG CAPS Take 1 capsule by mouth daily.   Past Week at Unknown time  . MAGNESIUM PO Take by mouth.   Past Week at Unknown time  . Omega-3 Fatty Acids (FISH OIL) 1000 MG CAPS Take 1 capsule by mouth daily.   Past Week at Unknown time  . Progesterone Micronized (PROGESTERONE, BULK,) POWD Take  100mg  daily for 12 days per 3 months 1 each 12 04/15/2018 at Unknown time  . tapentadol (NUCYNTA) 50 MG tablet Take 2 tablets by mouth at bedtime.  Past Week at Unknown time  . VITAMIN E PO Take by mouth.   Past Week at Unknown time  . VITAMIN K PO Take by mouth.   Past Week at Unknown time  . zolpidem (AMBIEN) 10 MG tablet Take 10 mg by mouth at bedtime as needed.    04/21/2018 at Unknown time  . HYDROcodone-acetaminophen (NORCO/VICODIN) 5-325 MG tablet Take by mouth.   More than a month at Unknown time  . Misc. Devices MISC Massage therapy twice a week for cervical stenosis and occipital neuralgia   Not Taking    ROS  Blood pressure 115/71, pulse 85, temperature 97.9 F (36.6 C), temperature source Oral, height 5\' 5"  (1.651 m), weight 59.3 kg, SpO2 100 %. Physical Exam  Breathing, conversing, and ambulating normally Well nourished, well hydrated White  female, no apparent distress Heart- rrr Lungs- CTAB Abd- benign  Results for orders placed or performed during the hospital encounter of 04/22/18 (from the past 24 hour(s))  Basic metabolic panel     Status: None   Collection Time: 04/22/18 11:25 AM  Result Value Ref Range   Sodium 141 135 - 145 mmol/L   Potassium 3.7 3.5 - 5.1 mmol/L   Chloride 108 98 - 111 mmol/L   CO2 22 22 - 32 mmol/L   Glucose, Bld 88 70 - 99 mg/dL   BUN 12 8 - 23 mg/dL   Creatinine, Ser 6.57 0.44 - 1.00 mg/dL   Calcium 84.6 8.9 - 96.2 mg/dL   GFR calc non Af Amer >60 >60 mL/min   GFR calc Af Amer >60 >60 mL/min   Anion gap 11 5 - 15    No results found.  Assessment/Plan: Endometrial thickening in a PMB woman who declines a repeat EMBX. I will plan for a d&c, hysteroscopy, and possible Myosure polyp resection. I will also biopsy the vulvar area that concerns her.  She understands the risks of surgery, including, but not to infection, bleeding, DVTs, damage to bowel, bladder, ureters. She wishes to proceed.     Allie Bossier 04/22/2018, 12:25 PM

## 2018-04-22 NOTE — Discharge Instructions (Signed)
Post Anesthesia Home Care Instructions  Activity: Get plenty of rest for the remainder of the day. A responsible individual must stay with you for 24 hours following the procedure.  For the next 24 hours, DO NOT: -Drive a car -Advertising copywriter -Drink alcoholic beverages -Take any medication unless instructed by your physician -Make any legal decisions or sign important papers.  Meals: Start with liquid foods such as gelatin or soup. Progress to regular foods as tolerated. Avoid greasy, spicy, heavy foods. If nausea and/or vomiting occur, drink only clear liquids until the nausea and/or vomiting subsides. Call your physician if vomiting continues.  Special Instructions/Symptoms: Your throat may feel dry or sore from the anesthesia or the breathing tube placed in your throat during surgery. If this causes discomfort, gargle with warm salt water. The discomfort should disappear within 24 hours.  If you had a scopolamine patch placed behind your ear for the management of post- operative nausea and/or vomiting:  1. The medication in the patch is effective for 72 hours, after which it should be removed.  Wrap patch in a tissue and discard in the trash. Wash hands thoroughly with soap and water. 2. You may remove the patch earlier than 72 hours if you experience unpleasant side effects which may include dry mouth, dizziness or visual disturbances. 3. Avoid touching the patch. Wash your hands with soap and water after contact with the patch.         Dilation and Curettage or Vacuum Curettage, Care After This sheet gives you information about how to care for yourself after your procedure. Your health care provider may also give you more specific instructions. If you have problems or questions, contact your health care provider. What can I expect after the procedure? After your procedure, it is common to have:  Mild pain or cramping.  Some vaginal bleeding or spotting. These may last  for up to 2 weeks after your procedure. Follow these instructions at home: Activity   Do not drive or use heavy machinery while taking prescription pain medicine.  Avoid driving for the first 24 hours after your procedure.  Take frequent, short walks, followed by rest periods, throughout the day. Ask your health care provider what activities are safe for you. After 1-2 days, you may be able to return to your normal activities.  Do not lift anything heavier than 10 lb (4.5 kg) until your health care provider approves.  For at least 2 weeks, or as long as told by your health care provider, do not: ? Douche. ? Use tampons. ? Have sexual intercourse. General instructions   Take over-the-counter and prescription medicines only as told by your health care provider. This is especially important if you take blood thinning medicine.  Do not take baths, swim, or use a hot tub until your health care provider approves. Take showers instead of baths.  Wear compression stockings as told by your health care provider. These stockings help to prevent blood clots and reduce swelling in your legs.  It is your responsibility to get the results of your procedure. Ask your health care provider, or the department performing the procedure, when your results will be ready.  Keep all follow-up visits as told by your health care provider. This is important. Contact a health care provider if:  You have severe cramps that get worse or that do not get better with medicine.  You have severe abdominal pain.  You cannot drink fluids without vomiting.  You develop pain  in a different area of your pelvis.  You have bad-smelling vaginal discharge.  You have a rash. Get help right away if:  You have vaginal bleeding that soaks more than one sanitary pad in 1 hour, for 2 hours in a row.  You pass large blood clots from your vagina.  You have a fever that is above 100.68F (38.0C).  Your abdomen feels  very tender or hard.  You have chest pain.  You have shortness of breath.  You cough up blood.  You feel dizzy or light-headed.  You faint.  You have pain in your neck or shoulder area. This information is not intended to replace advice given to you by your health care provider. Make sure you discuss any questions you have with your health care provider. Document Released: 02/07/2000 Document Revised: 10/09/2015 Document Reviewed: 09/12/2015 Elsevier Interactive Patient Education  2019 [employer].

## 2018-04-25 ENCOUNTER — Encounter (HOSPITAL_BASED_OUTPATIENT_CLINIC_OR_DEPARTMENT_OTHER): Payer: Self-pay | Admitting: Obstetrics & Gynecology

## 2018-04-28 NOTE — Addendum Note (Signed)
Addendum  created 04/28/18 1039 by Kaylyn Layer, MD   Clinical Note Signed

## 2018-04-28 NOTE — Anesthesia Postprocedure Evaluation (Signed)
Anesthesia Post Note  Patient: Gina Mccall  Procedure(s) Performed: DILATATION & CURETTAGE/HYSTEROSCOPY (N/A Perineum) VULVAR BIOPSY (N/A Perineum)  I was informed by nursing staff that [name] called [facility] on 3/4 and asked that she be called by an anesthesiologist regarding postprocedural sore throat. I called her back this morning and was able to speak with her regarding her concerns. She tells me that has experienced very sore throat since her procedure on 04/22/18. Her pain has improved over the past several days but she wanted to make sure her anesthesia team was aware of her condition. I informed her that she had an LMA placed during her procedure and that sore throat can unfortunately be an unavoidable side effect of any airway instrumentation. She has been taking appropriate conservative measures and does not have any concerns that would require immediate evaluation or treatment. I told her that it would be appropriate to be seen in person by her PCP (or ED if urgent need) if she has any ongoing concerns. All of her questions were answered to her satisfaction.  Dereck Ligas, MD

## 2018-06-06 ENCOUNTER — Encounter: Payer: Medicare HMO | Admitting: Obstetrics & Gynecology

## 2018-06-06 ENCOUNTER — Encounter: Payer: Self-pay | Admitting: Obstetrics & Gynecology

## 2018-06-06 ENCOUNTER — Ambulatory Visit (INDEPENDENT_AMBULATORY_CARE_PROVIDER_SITE_OTHER): Payer: Medicare HMO | Admitting: Obstetrics & Gynecology

## 2018-06-06 ENCOUNTER — Other Ambulatory Visit: Payer: Self-pay

## 2018-06-06 DIAGNOSIS — Z9889 Other specified postprocedural states: Secondary | ICD-10-CM

## 2018-06-06 DIAGNOSIS — M810 Age-related osteoporosis without current pathological fracture: Secondary | ICD-10-CM

## 2018-06-06 MED ORDER — ALENDRONATE SODIUM 70 MG PO TABS: 70.0000 mg | ORAL_TABLET | ORAL | 12 refills | Status: DC

## 2018-06-06 NOTE — Progress Notes (Signed)
TELEHEALTH VIRTUAL GYNECOLOGY VISIT ENCOUNTER NOTE  I connected with Gina Mccall on 06/06/18 at  2:15 PM EDT by telephone at home and verified that I am speaking with the correct person using two identifiers.   I discussed the limitations, risks, security and privacy concerns of performing an evaluation and management service by telephone and the availability of in person appointments. I also discussed with the patient that there may be a patient responsible charge related to this service. The patient expressed understanding and agreed to proceed.   History:  Gina Mccall is a 73 y.o. [id number] female being evaluated today for post op state, had a hysteroscopy and resection of uterine polyp about 6 weeks ago. She also wants to discuss her DEXA results - osteoporosis. She denies any abnormal vaginal discharge, bleeding, pelvic pain or other concerns.       Past Medical History:  Diagnosis Date  . Chronic pain syndrome   . Duodenal ulcer   . GERD (gastroesophageal reflux disease)   . H pylori ulcer 2017   hx of  . Insomnia   . MTHFR gene mutation (HCC)   . 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 Osteopetrosis   . Peripheral neuropathy   . Tail bone pain tail bone removed 1965    Past Surgical History:  Procedure Laterality Date  . CAUTERIZE INNER NOSE    . HYSTEROSCOPY W/D&C N/A 04/22/2018   Procedure: DILATATION & CURETTAGE/HYSTEROSCOPY;  Surgeon: Allie Bossierove, Magdaleno Lortie C, MD;  Location: [facility];  Service: Gynecology;  Laterality: N/A;  . KNEE SURGERY Left 1987  . tail bone  removed 1965  . VULVA /PERINEUM BIOPSY N/A 04/22/2018   Procedure: VULVAR BIOPSY;  Surgeon: Allie Bossierove, Hosey Burmester C, MD;  Location: [facility];  Service: Gynecology;  Laterality: N/A;   The following portions of the patient's history were reviewed and updated as appropriate: allergies, current medications, past family history, past medical history, past social history, past surgical history and problem  list.   Health Maintenance:    Normal mammogram on 2/20 .   Review of Systems:  Pertinent items noted in HPI and remainder of comprehensive ROS otherwise negative. She reports that a Vitamin D level recently was 81.  Physical Exam:   General:  Alert, oriented and cooperative.   Mental Status: Normal mood and affect perceived. Normal judgment and thought content.  Physical exam deferred due to nature of the encounter  Labs and Imaging No results found for this or any previous visit (from the past 336 hour(s)). No results found.    Assessment and Plan:     1. Post-operative state - doing well - negative pathology - she is taking progesterone q months along with the estrogen patch - She tried going off of the estrogen but hated her quality of life. She restarted it but is aware of ACOG recs.  2. Age-related osteoporosis without current pathological fracture - trial of foxamax 70 mg weekly - will need another DEXA in 3 years - continue her shaking machine       I discussed the assessment and treatment plan with the patient. The patient was provided an opportunity to ask questions and all were answered. The patient agreed with the plan and demonstrated an understanding of the instructions.   The patient was advised to call back or seek an in-person evaluation/go to the ED if the symptoms worsen or if the condition fails to improve as anticipated.  I provided 12 minutes of non-face-to-face  time during this encounter.   Allie Bossier, MD [facility] [employer], [facility]

## 2019-03-02 ENCOUNTER — Other Ambulatory Visit: Payer: Self-pay | Admitting: Obstetrics & Gynecology

## 2019-03-02 DIAGNOSIS — Z1231 Encounter for screening mammogram for malignant neoplasm of breast: Secondary | ICD-10-CM

## 2019-03-02 DIAGNOSIS — Z79899 Other long term (current) drug therapy: Secondary | ICD-10-CM

## 2019-03-02 NOTE — Progress Notes (Signed)
Gyn u/s ordered to check endometrial lining as she is taking unopposed estrogen.

## 2019-04-19 ENCOUNTER — Ambulatory Visit (INDEPENDENT_AMBULATORY_CARE_PROVIDER_SITE_OTHER): Payer: Medicare HMO

## 2019-04-19 ENCOUNTER — Other Ambulatory Visit: Payer: Self-pay

## 2019-04-19 DIAGNOSIS — Z79899 Other long term (current) drug therapy: Secondary | ICD-10-CM

## 2019-04-19 DIAGNOSIS — Z1231 Encounter for screening mammogram for malignant neoplasm of breast: Secondary | ICD-10-CM | POA: Diagnosis not present

## 2019-04-24 ENCOUNTER — Other Ambulatory Visit: Payer: Self-pay | Admitting: Obstetrics & Gynecology

## 2019-04-24 DIAGNOSIS — R928 Other abnormal and inconclusive findings on diagnostic imaging of breast: Secondary | ICD-10-CM

## 2019-04-27 ENCOUNTER — Other Ambulatory Visit: Payer: Self-pay

## 2019-04-27 ENCOUNTER — Encounter: Payer: Self-pay | Admitting: Obstetrics & Gynecology

## 2019-04-27 ENCOUNTER — Ambulatory Visit (INDEPENDENT_AMBULATORY_CARE_PROVIDER_SITE_OTHER): Payer: Medicare HMO | Admitting: Obstetrics & Gynecology

## 2019-04-27 VITALS — BP 125/82 | HR 69 | Resp 16 | Ht 65.0 in | Wt 139.0 lb

## 2019-04-27 DIAGNOSIS — Z01419 Encounter for gynecological examination (general) (routine) without abnormal findings: Secondary | ICD-10-CM | POA: Diagnosis not present

## 2019-04-27 DIAGNOSIS — L900 Lichen sclerosus et atrophicus: Secondary | ICD-10-CM | POA: Diagnosis not present

## 2019-04-27 MED ORDER — ESTRADIOL 0.025 MG/24HR TD PTTW: MEDICATED_PATCH | TRANSDERMAL | 12 refills | Status: DC

## 2019-04-27 MED ORDER — ESTRADIOL 2 MG VA RING: 2.0000 mg | VAGINAL_RING | VAGINAL | 12 refills | Status: DC

## 2019-04-27 MED ORDER — PROGESTERONE MICRONIZED POWD: 6 refills | Status: DC

## 2019-04-27 NOTE — Progress Notes (Signed)
Subjective:    Gina Mccall is a 74 y.o. married who presents for an annual exam. The patient has no complaints today. She needs refills of her HRT.  The patient is sexually active. GYN screening history: last pap: was normal. The patient wears seatbelts: yes. The patient participates in regular exercise: yes. Has the patient ever been transfused or tattooed?: no. The patient reports that there is not domestic violence in her life.   Menstrual History: OB History    Gravida  4   Para  3   Term  3   Preterm      AB  1   Living  3     SAB  1   TAB      Ectopic      Multiple      Live Births              Menarche age: 26 No LMP recorded. Patient is postmenopausal.    The following portions of the patient's history were reviewed and updated as appropriate: allergies, current medications, past family history, past medical history, past social history, past surgical history and problem list.  Review of Systems Pertinent items are noted in HPI.   Married since [address] LMP at 74 years old She gets an annual gyn u/s to measure her endometrium. It was 3 mm recently. Her next colonoscopy is due 2025. Last mammogram was next week.   Objective:    BP 125/82   Pulse 69   Resp 16   Ht 5\' 5"  (1.651 m)   Wt 139 lb (63 kg)   BMI 23.13 kg/m   General Appearance:    Alert, cooperative, no distress, appears stated age  Head:    Normocephalic, without obvious abnormality, atraumatic  Eyes:    PERRL, conjunctiva/corneas clear, EOM's intact, fundi    benign, both eyes  Ears:    Normal TM's and external ear canals, both ears  Nose:   Nares normal, septum midline, mucosa normal, no drainage    or sinus tenderness  Throat:   Lips, mucosa, and tongue normal; teeth and gums normal  Neck:   Supple, symmetrical, trachea midline, no adenopathy;    thyroid:  no enlargement/tenderness/nodules; no carotid   bruit or JVD  Back:     Symmetric, no curvature, ROM normal, no CVA tenderness   Lungs:     Clear to auscultation bilaterally, respirations unlabored  Chest Wall:    No tenderness or deformity   Heart:    Regular rate and rhythm, S1 and S2 normal, no murmur, rub   or gallop  Breast Exam:    No tenderness, masses, or nipple abnormality  Abdomen:     Soft, non-tender, bowel sounds active all four quadrants,    no masses, no organomegaly  Genitalia:    Normal female without lesion, discharge or tenderness, she has a h/o lichen sclerosis (biopsy-proven), but no lesions on exam. She knows to do monthly SVEs. normal size and shape, anteverted, mobile, non-tender, normal adnexal exam      Extremities:   Extremities normal, atraumatic, no cyanosis or edema  Pulses:   2+ and symmetric all extremities  Skin:   Skin color, texture, turgor normal, no rashes or lesions  Lymph nodes:   Cervical, supraclavicular, and axillary nodes normal  Neurologic:   CNII-XII intact, normal strength, sensation and reflexes    throughout  .    Assessment:    Healthy female exam.    Plan:  Rec continued healthy lifestyle choices   HRT prescriptions renewed.

## 2019-05-01 ENCOUNTER — Other Ambulatory Visit: Payer: Self-pay

## 2019-05-01 ENCOUNTER — Ambulatory Visit
Admission: RE | Admit: 2019-05-01 | Discharge: 2019-05-01 | Disposition: A | Discharge status: Home / Self Care | Payer: Medicare HMO | Source: Ambulatory Visit | Attending: Obstetrics & Gynecology | Admitting: Obstetrics & Gynecology

## 2019-05-01 DIAGNOSIS — R928 Other abnormal and inconclusive findings on diagnostic imaging of breast: Secondary | ICD-10-CM

## 2020-03-12 ENCOUNTER — Other Ambulatory Visit: Payer: Self-pay | Admitting: *Deleted

## 2020-03-12 DIAGNOSIS — Z79890 Hormone replacement therapy (postmenopausal): Secondary | ICD-10-CM

## 2020-04-14 IMAGING — MG DIGITAL SCREENING BILAT W/ TOMO W/ CAD
8 series · 8 of 24 positions shown · non-contrast
Comparison: Previous exam(s).

CLINICAL DATA: Screening.

EXAM:
DIGITAL SCREENING BILATERAL MAMMOGRAM WITH TOMO AND CAD

[R MLO synth-2D]
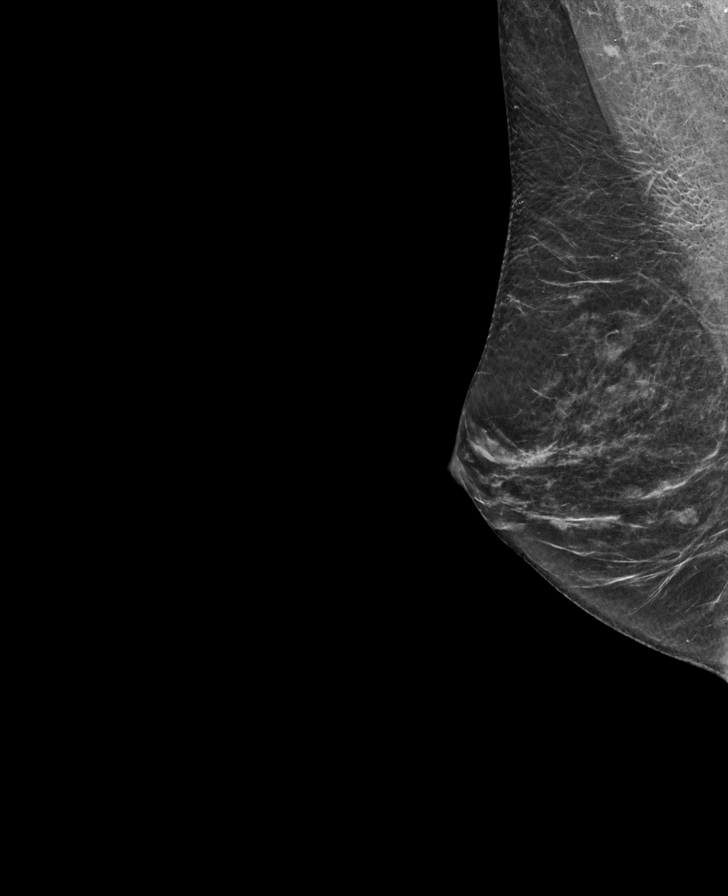

[R CC synth-2D]
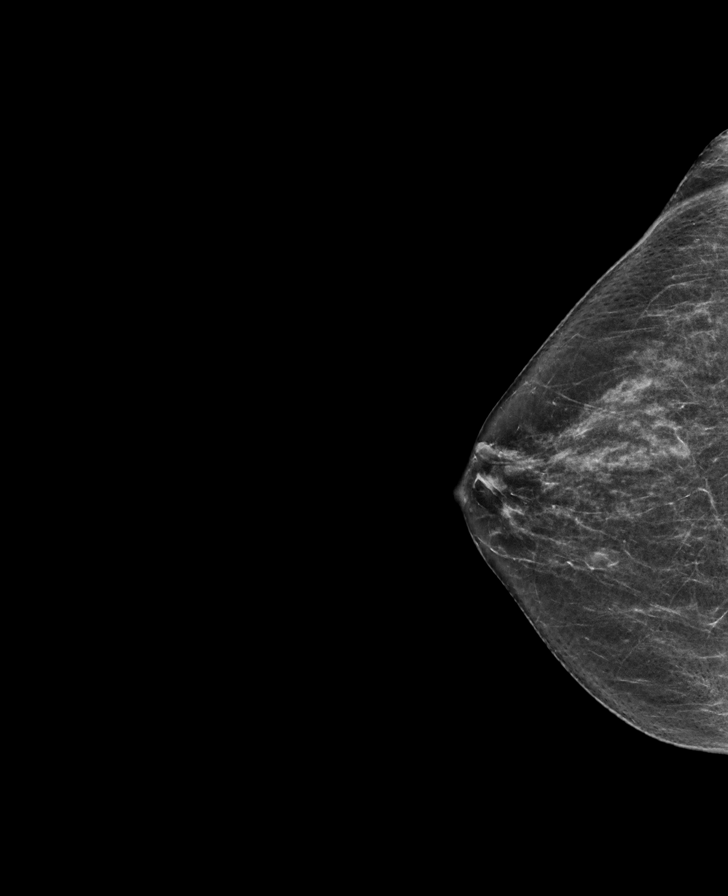

[L MLO synth-2D]
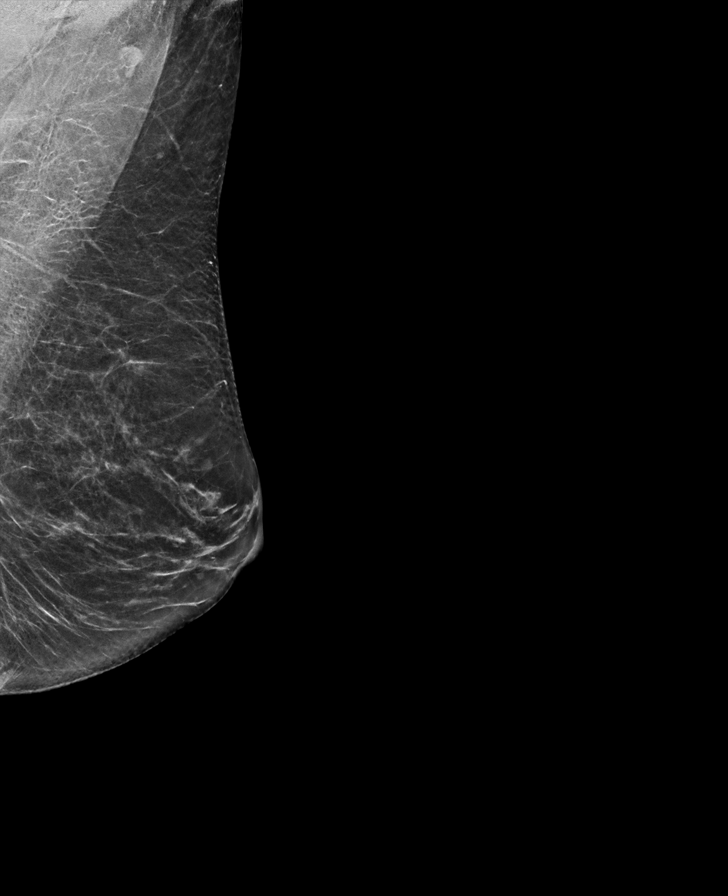

[L CC synth-2D]
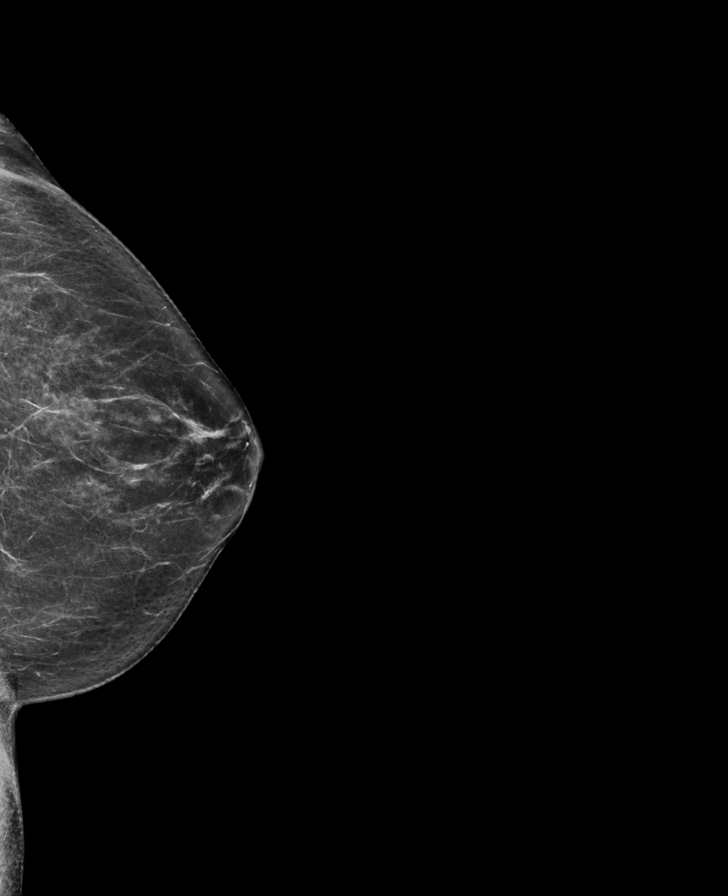

[L MLO tomo · tomo slice 31/61.0]
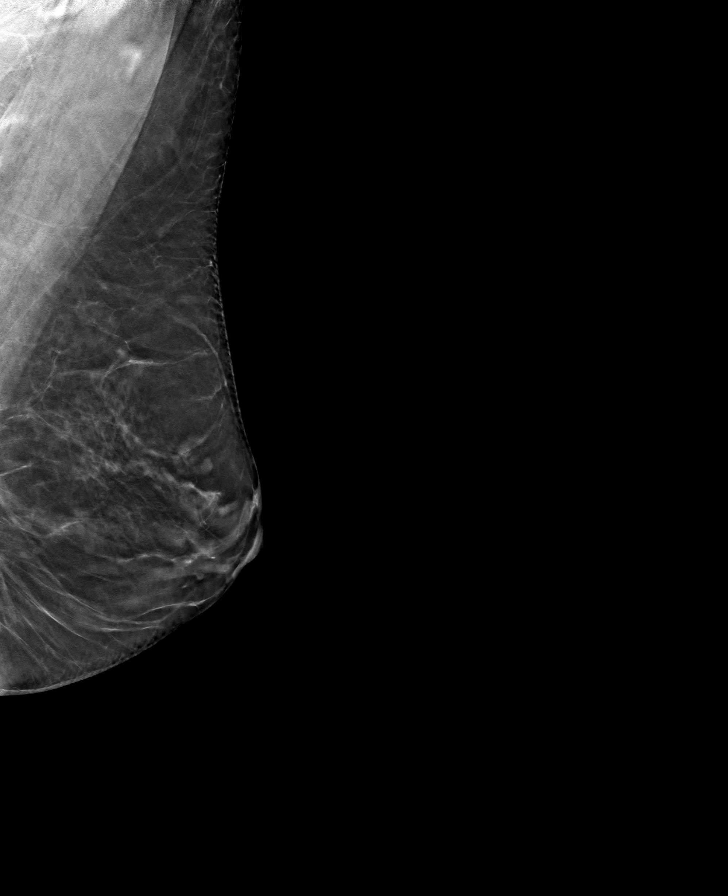

[R MLO tomo · tomo slice 30/59.0]
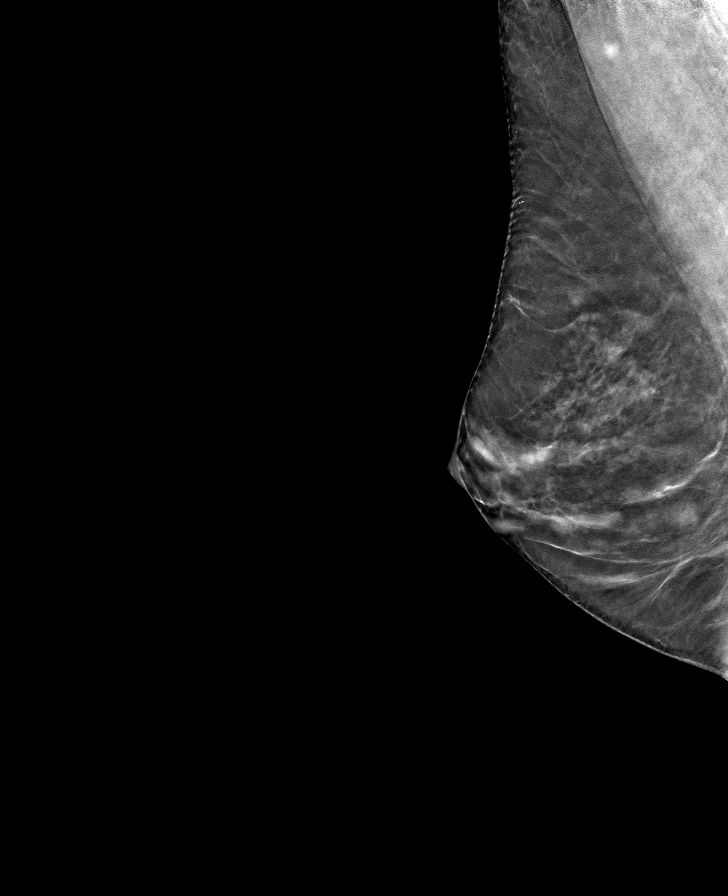

[R CC tomo · tomo slice 31/60.0]
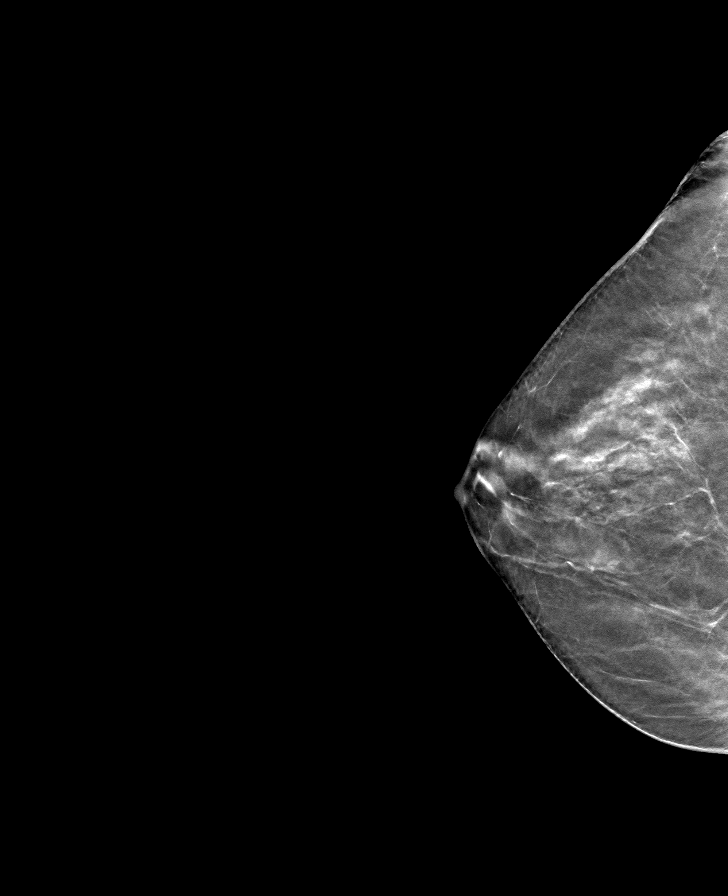

[L CC tomo · tomo slice 32/63.0]
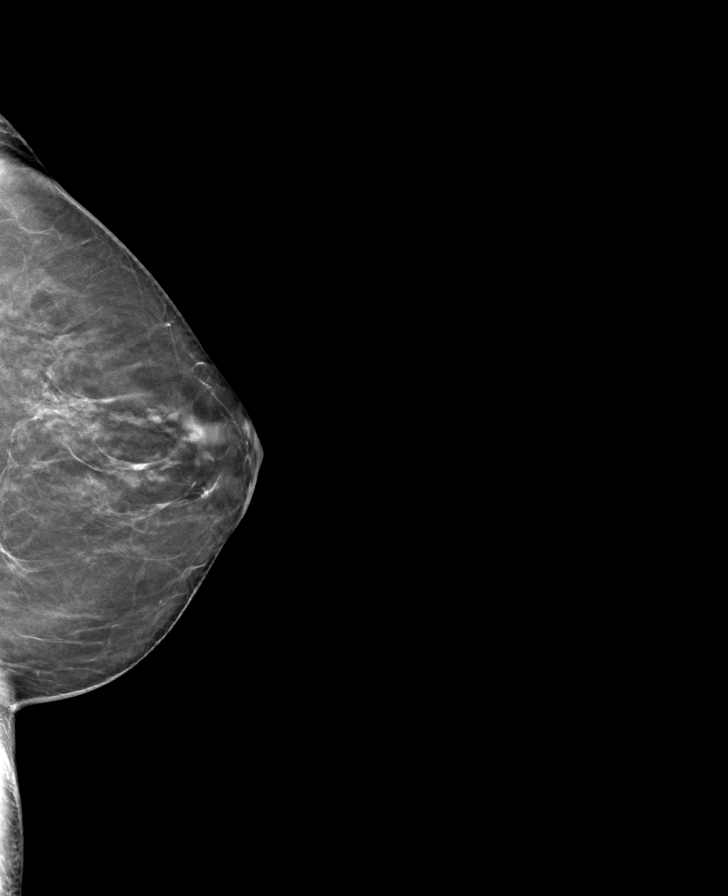

[8 of 24 positions shown; findings below may reference images not displayed]

ACR Breast Density Category b: There are scattered areas of
fibroglandular density.
FINDINGS: In the right breast, a possible mass warrants further evaluation. In
the left breast, no findings suspicious for malignancy. Images were
processed with CAD.
IMPRESSION: Further evaluation is suggested for possible mass in the right
breast.

RECOMMENDATION:
Diagnostic mammogram and possibly ultrasound of the right breast.
(Code:T1-A-550)

The patient will be contacted regarding the findings, and additional
imaging will be scheduled.

BI-RADS CATEGORY  0: Incomplete. Need additional imaging evaluation
and/or prior mammograms for comparison.

## 2020-04-18 ENCOUNTER — Ambulatory Visit (INDEPENDENT_AMBULATORY_CARE_PROVIDER_SITE_OTHER): Payer: Medicare HMO

## 2020-04-18 ENCOUNTER — Other Ambulatory Visit: Payer: Self-pay | Admitting: Obstetrics and Gynecology

## 2020-04-18 ENCOUNTER — Other Ambulatory Visit: Payer: Self-pay

## 2020-04-18 ENCOUNTER — Ambulatory Visit: Payer: Medicare HMO | Admitting: Obstetrics and Gynecology

## 2020-04-18 ENCOUNTER — Encounter: Payer: Self-pay | Admitting: Obstetrics and Gynecology

## 2020-04-18 VITALS — BP 99/66 | HR 67 | Resp 16 | Ht 65.0 in | Wt 125.0 lb

## 2020-04-18 DIAGNOSIS — N644 Mastodynia: Secondary | ICD-10-CM | POA: Diagnosis not present

## 2020-04-18 DIAGNOSIS — Z1231 Encounter for screening mammogram for malignant neoplasm of breast: Secondary | ICD-10-CM

## 2020-04-18 DIAGNOSIS — M810 Age-related osteoporosis without current pathological fracture: Secondary | ICD-10-CM | POA: Diagnosis not present

## 2020-04-18 DIAGNOSIS — Z79890 Hormone replacement therapy (postmenopausal): Secondary | ICD-10-CM

## 2020-04-18 MED ORDER — PROGESTERONE MICRONIZED POWD: 6 refills | Status: DC

## 2020-04-18 MED ORDER — ESTRADIOL 0.025 MG/24HR TD PTTW: MEDICATED_PATCH | TRANSDERMAL | 12 refills | Status: DC

## 2020-04-18 MED ORDER — ESTRADIOL 2 MG VA RING: 2.0000 mg | VAGINAL_RING | VAGINAL | 12 refills | Status: DC

## 2020-04-18 NOTE — Progress Notes (Signed)
Pt states that she needs BMD ordered.  Wants Lt breast checked.

## 2020-04-18 NOTE — Progress Notes (Signed)
GYNECOLOGY OFFICE NOTE  History:  75 y.o. [id number] here today for pain in left breast under areola. Has been going on a while, feels it every month and reports it feels like "bone pain", has not had it looked at. Also on HRT and would like to continue it. She is aware of ACOG recs and reports her quality of life suffers without HRT.    Past Medical History:  Diagnosis Date  . Chronic pain syndrome   . Duodenal ulcer   . GERD (gastroesophageal reflux disease)   . H pylori ulcer 2017   hx of  . Insomnia   . MTHFR gene mutation   . 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 Osteopetrosis   . Peripheral neuropathy   . Tail bone pain tail bone removed 1965     Past Surgical History:  Procedure Laterality Date  . CAUTERIZE INNER NOSE    . HYSTEROSCOPY WITH D & C N/A 04/22/2018   Procedure: DILATATION & CURETTAGE/HYSTEROSCOPY;  Surgeon: Allie Bossier, MD;  Location: [facility];  Service: Gynecology;  Laterality: N/A;  . KNEE SURGERY Left 1987  . tail bone  removed 1965  . VULVA /PERINEUM BIOPSY N/A 04/22/2018   Procedure: VULVAR BIOPSY;  Surgeon: Allie Bossier, MD;  Location: [facility];  Service: Gynecology;  Laterality: N/A;     Current Outpatient Medications:  .  ASHWAGANDHA PO, Take by mouth., Disp: , Rfl:  .  BIOTIN PO, Take by mouth., Disp: , Rfl:  .  bismuth subsalicylate (PEPTO BISMOL) 262 MG/15ML suspension, Take by mouth., Disp: , Rfl:  .  calcium elemental as carbonate (BARIATRIC TUMS ULTRA) 400 MG chewable tablet, Chew by mouth., Disp: , Rfl:  .  Calcium-Magnesium-Vitamin D 500-250-200 MG-MG-UNIT TABS, Take by mouth., Disp: , Rfl:  .  Cholecalciferol (VITAMIN D3) 5000 units TABS, Take 50,000 Units by mouth once a week. , Disp: , Rfl:  .  clonazePAM (KLONOPIN) 0.5 MG tablet, TAKE 1 TABLET (0.5 MG TOTAL) BY MOUTH AT BEDTIME AS NEEDED FOR ANXIETY., Disp: , Rfl:  .  L-Methylfolate-Algae-B12-B6 (METANX) 3-90.314-2-35 MG CAPS, Take 1 capsule by mouth  daily., Disp: , Rfl:  .  MAGNESIUM PO, Take by mouth., Disp: , Rfl:  .  Misc. Devices MISC, Massage therapy twice a week for cervical stenosis and occipital neuralgia, Disp: , Rfl:  .  Omega-3 Fatty Acids (FISH OIL) 1000 MG CAPS, Take 1 capsule by mouth daily., Disp: , Rfl:  .  VITAMIN E PO, Take by mouth., Disp: , Rfl:  .  VITAMIN K PO, Take by mouth., Disp: , Rfl:  .  zolpidem (AMBIEN) 10 MG tablet, Take 10 mg by mouth at bedtime as needed., Disp: , Rfl:  .  estradiol (ESTRING) 2 MG vaginal ring, Place 2 mg vaginally every 3 (three) months. follow package directions, Disp: 1 each, Rfl: 12 .  estradiol (VIVELLE-DOT) 0.025 MG/24HR, Use  1/2 patch weekly, Disp: 8 patch, Rfl: 12 .  Progesterone Micronized (PROGESTERONE, BULK,) POWD, Take  100mg  daily for 12 days per 3 months, Disp: 5000 g, Rfl: 6  The following portions of the patient's history were reviewed and updated as appropriate: allergies, current medications, past family history, past medical history, past social history, past surgical history and problem list.   Review of Systems:  Pertinent items noted in HPI and remainder of comprehensive ROS otherwise negative.   Objective:  Physical Exam BP 99/66   Pulse 67   Resp 16  Ht 5\' 5"  (1.651 m)   Wt 125 lb (56.7 kg)   BMI 20.80 kg/m  CONSTITUTIONAL: Well-developed, well-nourished female in no acute distress.  HENT:  Normocephalic, atraumatic. External right and left ear normal. Oropharynx is clear and moist EYES: Conjunctivae and EOM are normal. Pupils are equal, round, and reactive to light. No scleral icterus.  NECK: Normal range of motion, supple, no masses SKIN: Skin is warm and dry. No rash noted. Not diaphoretic. No erythema. No pallor. NEUROLOGIC: Alert and oriented to person, place, and time. Normal reflexes, muscle tone coordination. No cranial nerve deficit noted. PSYCHIATRIC: Normal mood and affect. Normal behavior. Normal judgment and thought content. CARDIOVASCULAR:  Normal heart rate noted RESPIRATORY: Effort normal, no problems with respiration noted ABDOMEN: Soft, no distention noted.   PELVIC: Normal appearing external genitalia; normal appearing vaginal mucosa and cervix.  No abnormal discharge noted.  Normal uterine size, no other palpable masses, no uterine or adnexal tenderness. MUSCULOSKELETAL: Normal range of motion. No edema noted.  Exam done with chaperone present.  Labs and Imaging No results found.  Assessment & Plan:  1. Breast pain, left US/mammo ordered - no palpable mass although patient does report reproducible pain - Unlisted Procedure Breast; Future - MM 3D SCREEN BREAST BILATERAL; Future  2. Encounter for screening mammogram for malignant neoplasm of breast  3. Age-related osteoporosis without current pathological fracture - DG Bone Density; Future  4. Hormone replacement therapy (HRT) - patient aware of ACOG recs to use HRT at lowest dose for shortest amount of time, risks of malignancy, CV health - pt verbalizes understanding and acceptance of risks - she would like to continue as her quality of life greatly suffers when she stops - cont yearly TVUS as she does use unopposed estrogen - US done today but read not back yet   Routine preventative health maintenance measures emphasized. Please refer to After Visit Summary for other counseling recommendations.   Return in about 1 year (around 04/18/2021).  Total face-to-face time with patient: 28 minutes. Over 50% of encounter was spent on counseling and coordination of care.  04/20/2021, MD, [facility] Attending Center for [facility])

## 2020-04-22 ENCOUNTER — Other Ambulatory Visit: Payer: Self-pay | Admitting: Obstetrics and Gynecology

## 2020-05-08 ENCOUNTER — Other Ambulatory Visit: Payer: Self-pay

## 2020-05-08 ENCOUNTER — Ambulatory Visit (INDEPENDENT_AMBULATORY_CARE_PROVIDER_SITE_OTHER): Payer: Medicare HMO

## 2020-05-08 DIAGNOSIS — M810 Age-related osteoporosis without current pathological fracture: Secondary | ICD-10-CM

## 2020-06-04 ENCOUNTER — Ambulatory Visit
Admission: RE | Admit: 2020-06-04 | Discharge: 2020-06-04 | Disposition: A | Discharge status: Home / Self Care | Payer: Medicare HMO | Source: Ambulatory Visit | Attending: Obstetrics and Gynecology | Admitting: Obstetrics and Gynecology

## 2020-06-04 ENCOUNTER — Ambulatory Visit: Payer: Medicare HMO

## 2020-06-04 ENCOUNTER — Other Ambulatory Visit: Payer: Self-pay

## 2020-06-04 DIAGNOSIS — N644 Mastodynia: Secondary | ICD-10-CM

## 2020-06-05 ENCOUNTER — Other Ambulatory Visit: Payer: Medicare HMO

## 2020-09-09 ENCOUNTER — Other Ambulatory Visit: Payer: Self-pay

## 2020-09-09 ENCOUNTER — Emergency Department
Admission: RE | Admit: 2020-09-09 | Discharge: 2020-09-09 | Disposition: A | Discharge status: Home / Self Care | Payer: Medicare HMO | Source: Ambulatory Visit | Attending: Family Medicine | Admitting: Family Medicine

## 2020-09-09 ENCOUNTER — Emergency Department (INDEPENDENT_AMBULATORY_CARE_PROVIDER_SITE_OTHER): Payer: Medicare HMO

## 2020-09-09 VITALS — BP 119/75 | HR 70 | Temp 98.5°F | Resp 12 | Ht 65.0 in | Wt 130.0 lb

## 2020-09-09 DIAGNOSIS — S99192A Other physeal fracture of left metatarsal, initial encounter for closed fracture: Secondary | ICD-10-CM | POA: Diagnosis not present

## 2020-09-09 DIAGNOSIS — S99922A Unspecified injury of left foot, initial encounter: Secondary | ICD-10-CM | POA: Diagnosis not present

## 2020-09-09 DIAGNOSIS — M8000XA Age-related osteoporosis with current pathological fracture, unspecified site, initial encounter for fracture: Secondary | ICD-10-CM | POA: Diagnosis not present

## 2020-09-09 DIAGNOSIS — W19XXXA Unspecified fall, initial encounter: Secondary | ICD-10-CM | POA: Diagnosis not present

## 2020-09-09 MED ORDER — ACETAMINOPHEN 325 MG PO TABS
650.0000 mg | ORAL_TABLET | Freq: Once | ORAL | Status: AC
  Administered 2020-09-09: 650 mg via ORAL

## 2020-09-09 NOTE — Discharge Instructions (Addendum)
You have a fracture in your foot.  It does not appear to be healing well.  This is likely because of your osteoporosis.  It also needs to rest. Wear the boot at all times while upright.  May remove it to bathe.  If you remove it to sleep, you must put it on before you stepped down on the floor Try to stay off your foot is much as possible. You may take Tylenol, Advil or Aleve as needed for pain Call Dr. Jordan Likes in sports medicine to be seen later this week

## 2020-09-09 NOTE — ED Triage Notes (Signed)
Pt seen in UC with c/o left ankle swelling, burning and pain after an injury occurring June 28th. Pt states she was walking and turned her ankle. Pt denies taking any medications for her pain.

## 2020-09-09 NOTE — ED Provider Notes (Signed)
[facility]    CSN: [id number] Arrival date & time: 09/09/20  0943      History   Chief Complaint Chief Complaint  Patient presents with   Foot Injury    Lt foot    HPI Gina Mccall is a 75 y.o. female.   HPI  Patient is here for evaluation of foot pain.  She injured her foot almost a month ago.  She states she was wearing a hard soled shoe when she stepped on a [place] night.  Caused her to twist her ankle, inversion, and fall.  She has had pain in her foot ever since that time.  Date of injury was June 28.  She is here today because she continues to have pain and swelling.  She has not reduced her activity.  She is continuing to walk, exercise, and squats.  Patient has a history of significant osteoporosis.  She is on vitamin D and calcium supplements.  She states that she has failed other therapy for osteoporosis. Patient states that she has peripheral neuropathy.  She thinks this has affected her pain complaint in her foot.   Past Medical History:  Diagnosis Date   Chronic pain syndrome    Duodenal ulcer    GERD (gastroesophageal reflux disease)    H pylori ulcer 2017   hx of   Insomnia    MTHFR gene mutation    Occipital neuralgia due to fall   Osteopetrosis    Peripheral neuropathy    Tail bone pain tail bone removed 1965     Patient Active Problem List   Diagnosis Date Noted   Dupuytren's contracture of right hand 11/06/2015   Occipital neuralgia 11/06/2015   ABDOMINAL PAIN, EPIGASTRIC 06/14/2010   FACIAL PAIN 05/20/2010    Past Surgical History:  Procedure Laterality Date   CAUTERIZE INNER NOSE     HYSTEROSCOPY WITH D & C N/A 04/22/2018   Procedure: DILATATION & CURETTAGE/HYSTEROSCOPY;  Surgeon: Allie Bossier, MD;  Location: [facility];  Service: Gynecology;  Laterality: N/A;   KNEE SURGERY Left 1987   tail bone  removed 1965   VULVA /PERINEUM BIOPSY N/A 04/22/2018   Procedure: VULVAR BIOPSY;  Surgeon: Allie Bossier, MD;   Location: [facility];  Service: Gynecology;  Laterality: N/A;    OB History     Gravida  4   Para  3   Term  3   Preterm      AB  1   Living  3      SAB  1   IAB      Ectopic      Multiple      Live Births               Home Medications    Prior to Admission medications   Medication Sig Start Date End Date Taking? Authorizing Provider  ASHWAGANDHA PO Take by mouth.    [provider]  BIOTIN PO Take by mouth.    [provider]  bismuth subsalicylate (PEPTO BISMOL) 262 MG/15ML suspension Take by mouth.    [provider]  calcium elemental as carbonate (BARIATRIC TUMS ULTRA) 400 MG chewable tablet Chew by mouth.    [provider]  Calcium-Magnesium-Vitamin D 500-250-200 MG-MG-UNIT TABS Take by mouth.    [provider]  Cholecalciferol (VITAMIN D3) 5000 units TABS Take 50,000 Units by mouth once a week.     [provider]  clonazePAM Scarlette Calico)  0.5 MG tablet TAKE 1 TABLET (0.5 MG TOTAL) BY MOUTH AT BEDTIME AS NEEDED FOR ANXIETY. 10/25/14   [provider]  estradiol (ESTRING) 2 MG vaginal ring Place 2 mg vaginally every 3 (three) months. follow package directions 04/18/20   Conan Bowens, MD  estradiol (VIVELLE-DOT) 0.025 MG/24HR Use  1/2 patch weekly 04/18/20   Conan Bowens, MD  L-Methylfolate-Algae-B12-B6 [facility]) 3-90.314-2-35 MG CAPS Take 1 capsule by mouth daily.    [provider]  MAGNESIUM PO Take by mouth.    [provider]  Misc. Devices MISC Massage therapy twice a week for cervical stenosis and occipital neuralgia 11/04/16   [provider]  Omega-3 Fatty Acids (FISH OIL) 1000 MG CAPS Take 1 capsule by mouth daily.    [provider]  Progesterone Micronized (PROGESTERONE, BULK,) POWD Take  100mg  daily for 12 days per 3 months 04/18/20   04/20/20, MD  VITAMIN E PO Take by mouth.    [provider]  VITAMIN K PO Take by  mouth.    [provider]  zolpidem (AMBIEN) 10 MG tablet Take 10 mg by mouth at bedtime as needed.    Conan Bowens, MD    Family History Family History  Problem Relation Age of Onset   Cancer Father    Heart disease Father    Lung cancer Paternal Uncle     Social History Social History   Tobacco Use   Smoking status: Never   Smokeless tobacco: Never  Vaping Use   Vaping Use: Never used  Substance Use Topics   Alcohol use: Yes    Comment: occasional glass of wine   Drug use: No     Allergies   Clindamycin/lincomycin, Sulfa antibiotics, Erythromycin, and Sulfonamide derivatives   Review of Systems Review of Systems  See HPI Physical Exam Triage Vital Signs ED Triage Vitals  Enc Vitals Group     BP 09/09/20 0956 119/75     Pulse Rate 09/09/20 0956 70     Resp 09/09/20 0956 12     Temp 09/09/20 0956 98.5 F (36.9 C)     Temp Source 09/09/20 0956 Oral     SpO2 09/09/20 0956 96 %     Weight 09/09/20 0959 130 lb (59 kg)     Height 09/09/20 0959 5\' 5"  (1.651 m)     Head Circumference --      Peak Flow --      Pain Score 09/09/20 0959 8     Pain Loc --      Pain Edu? --      Excl. in GC? --    No data found.  Updated Vital Signs BP 119/75 (BP Location: Right Arm)   Pulse 70   Temp 98.5 F (36.9 C) (Oral)   Resp 12   Ht 5\' 5"  (1.651 m)   Wt 59 kg   SpO2 96%   BMI 21.63 kg/m      Physical Exam Constitutional:      General: She is not in acute distress.    Appearance: She is well-developed and normal weight.  HENT:     Head: Normocephalic and atraumatic.     Mouth/Throat:     Comments: Is wearing mask Eyes:     Conjunctiva/sclera: Conjunctivae normal.     Pupils: Pupils are equal, round, and reactive to light.  Cardiovascular:     Rate and Rhythm: Normal rate.  Pulmonary:     Effort: Pulmonary  effort is normal. No respiratory distress.  Abdominal:     General: There is no distension.     Palpations: Abdomen is soft.   Musculoskeletal:        General: Normal range of motion.     Cervical back: Normal range of motion.       Feet:  Skin:    General: Skin is warm and dry.  Neurological:     Mental Status: She is alert.     Gait: Gait abnormal.  Psychiatric:        Behavior: Behavior normal.     UC Treatments / Results  Labs (all labs ordered are listed, but only abnormal results are displayed) Labs Reviewed - No data to display  EKG   Radiology DG Foot Complete Left  Result Date: 09/09/2020 CLINICAL DATA:  Inversion injury left foot 3 weeks ago, lateral pain EXAM: LEFT FOOT - COMPLETE 3+ VIEW COMPARISON:  None. FINDINGS: Transverse nondisplaced Jones fracture of the fifth metatarsal proximal metaphysis is observed. Normal alignment at the Lisfranc joint. No other fracture is appreciated. IMPRESSION: 1. Transverse nondisplaced Jones fracture of the fifth metatarsal proximal metaphysis. Electronically Signed   By: Gaylyn Rong M.D.   On: 09/09/2020 10:45    Procedures Procedures (including critical care time)  Medications Ordered in UC Medications  acetaminophen (TYLENOL) tablet 650 mg (650 mg Oral Given 09/09/20 1010)    Initial Impression / Assessment and Plan / UC Course  I have reviewed the triage vital signs and the nursing notes.  Pertinent labs & imaging results that were available during my care of the patient were reviewed by me and considered in my medical decision making (see chart for details).     I called Dr. Clare Gandy of sports medicine to discuss this injury.  Jones fractures often do not heal well.  Especially with osteoporosis.  She has not reduced her activity and this fracture does not appear to be healing.  He recommends nonweightbearing.  Cam walking boot.  Follow-up with him.  Patient declines crutches or scooter.  Will discuss with specialist when she sees him Final Clinical Impressions(s) / UC Diagnoses   Final diagnoses:  Age-related osteoporosis with  current pathological fracture, initial encounter  Fall, initial encounter  Closed fracture of base of fifth metatarsal bone of left foot at metaphyseal-diaphyseal junction, initial encounter     Discharge Instructions      You have a fracture in your foot.  It does not appear to be healing well.  This is likely because of your osteoporosis.  It also needs to rest. Wear the boot at all times while upright.  May remove it to bathe.  If you remove it to sleep, you must put it on before you stepped down on the floor Try to stay off your foot is much as possible. You may take Tylenol, Advil or Aleve as needed for pain Call Dr. Jordan Likes in sports medicine to be seen later this week   ED Prescriptions   None    PDMP not reviewed this encounter.   Eustace Moore, MD 09/09/20 [phone]

## 2020-09-12 ENCOUNTER — Encounter: Payer: Self-pay | Admitting: Family Medicine

## 2020-09-12 ENCOUNTER — Other Ambulatory Visit: Payer: Self-pay

## 2020-09-12 ENCOUNTER — Ambulatory Visit: Payer: Medicare HMO | Admitting: Family Medicine

## 2020-09-12 VITALS — BP 102/80 | Ht 65.0 in | Wt 130.0 lb

## 2020-09-12 DIAGNOSIS — S92355A Nondisplaced fracture of fifth metatarsal bone, left foot, initial encounter for closed fracture: Secondary | ICD-10-CM | POA: Diagnosis not present

## 2020-09-12 DIAGNOSIS — M8000XA Age-related osteoporosis with current pathological fracture, unspecified site, initial encounter for fracture: Secondary | ICD-10-CM

## 2020-09-12 DIAGNOSIS — S92355D Nondisplaced fracture of fifth metatarsal bone, left foot, subsequent encounter for fracture with routine healing: Secondary | ICD-10-CM | POA: Insufficient documentation

## 2020-09-12 NOTE — Patient Instructions (Signed)
Nice to meet you Please continue the boot  Please try to limit the weight on the left foot   Please send me a message in MyChart with any questions or updates.  Please see me back in 2 weeks.   --Dr. Jordan Likes

## 2020-09-12 NOTE — Progress Notes (Signed)
Gina Mccall - 75 y.o. female MRN [id number]  Date of birth: 01-04-1946  SUBJECTIVE:  Including CC & ROS.  No chief complaint on file.   Gina Mccall is a 75 y.o. female that is presenting with left foot pain.  She was diagnosed with a fracture of the fifth metatarsal.  Her injury occurred about 3 weeks ago.  Having swelling and pain at the base of the fifth.  Independent review of the left foot x-ray from 7/18 shows a Jones fracture of the fifth metatarsal.   Review of Systems See HPI   HISTORY: Past Medical, Surgical, Social, and Family History Reviewed & Updated per EMR.   Pertinent Historical Findings include:  Past Medical History:  Diagnosis Date   Chronic pain syndrome    Duodenal ulcer    GERD (gastroesophageal reflux disease)    H pylori ulcer 2017   hx of   Insomnia    MTHFR gene mutation    Occipital neuralgia due to fall   Osteopetrosis    Peripheral neuropathy    Tail bone pain tail bone removed 1965     Past Surgical History:  Procedure Laterality Date   CAUTERIZE INNER NOSE     HYSTEROSCOPY WITH D & C N/A 04/22/2018   Procedure: DILATATION & CURETTAGE/HYSTEROSCOPY;  Surgeon: Allie Bossier, MD;  Location: [facility];  Service: Gynecology;  Laterality: N/A;   KNEE SURGERY Left 1987   tail bone  removed 1965   VULVA /PERINEUM BIOPSY N/A 04/22/2018   Procedure: VULVAR BIOPSY;  Surgeon: Allie Bossier, MD;  Location: [facility];  Service: Gynecology;  Laterality: N/A;    Family History  Problem Relation Age of Onset   Cancer Father    Heart disease Father    Lung cancer Paternal Uncle     Social History   Socioeconomic History   Marital status: Married    Spouse name: Not on file   Number of children: Not on file   Years of education: Not on file   Highest education level: Not on file  Occupational History   Not on file  Tobacco Use   Smoking status: Never   Smokeless tobacco: Never  Vaping Use   Vaping Use: Never  used  Substance and Sexual Activity   Alcohol use: Yes    Comment: occasional glass of wine   Drug use: No   Sexual activity: Not on file  Other Topics Concern   Not on file  Social History Narrative   Not on file   Social Determinants of Health   Financial Resource Strain: Not on file  Food Insecurity: Not on file  Transportation Needs: Not on file  Physical Activity: Not on file  Stress: Not on file  Social Connections: Not on file  Intimate Partner Violence: Not on file     PHYSICAL EXAM:  VS: BP 102/80 (BP Location: Left Arm, Patient Position: Sitting, Cuff Size: Normal)   Ht 5\' 5"  (1.651 m)   Wt 130 lb (59 kg)   BMI 21.63 kg/m  Physical Exam Gen: NAD, alert, cooperative with exam, well-appearing MSK:  Left foot: Swelling and tenderness to palpation at the base of the fifth metatarsal. Normal range of motion. Normal strength resistance. Neurovascularly intact     ASSESSMENT & PLAN:   Nondisplaced fracture of fifth metatarsal bone, left foot, initial encounter for closed fracture Initial injury around 6/30. -Counseled on supportive care. -Counseled on CAM Walker. -Counseled on nonweightbearing. -Wheelchair. -Follow-up in  2 weeks.  Age-related osteoporosis with current pathological fracture Has a long history of osteoporosis for his age 17.  Reports several intolerances to different medications that she has tried. -Counseled on supportive care

## 2020-09-12 NOTE — Assessment & Plan Note (Signed)
Has a long history of osteoporosis for his age 75.  Reports several intolerances to different medications that she has tried. -Counseled on supportive care

## 2020-09-12 NOTE — Assessment & Plan Note (Signed)
Initial injury around 6/30. -Counseled on supportive care. -Counseled on CAM Walker. -Counseled on nonweightbearing. -Wheelchair. -Follow-up in 2 weeks.

## 2020-09-26 ENCOUNTER — Ambulatory Visit (HOSPITAL_BASED_OUTPATIENT_CLINIC_OR_DEPARTMENT_OTHER)
Admission: RE | Admit: 2020-09-26 | Discharge: 2020-09-26 | Disposition: A | Discharge status: Home / Self Care | Payer: Medicare HMO | Source: Ambulatory Visit | Attending: Family Medicine | Admitting: Family Medicine

## 2020-09-26 ENCOUNTER — Ambulatory Visit: Payer: Medicare HMO | Admitting: Family Medicine

## 2020-09-26 ENCOUNTER — Other Ambulatory Visit: Payer: Self-pay

## 2020-09-26 ENCOUNTER — Encounter: Payer: Self-pay | Admitting: Family Medicine

## 2020-09-26 VITALS — BP 110/78 | Ht 65.0 in | Wt 130.0 lb

## 2020-09-26 DIAGNOSIS — S92355D Nondisplaced fracture of fifth metatarsal bone, left foot, subsequent encounter for fracture with routine healing: Secondary | ICD-10-CM

## 2020-09-26 NOTE — Patient Instructions (Signed)
Good to see you Please try ice as needed  Please continue the boot  I will call with the results from today   Please send me a message in MyChart with any questions or updates.  Please see me back in 4 weeks.   --Dr. Jordan Likes

## 2020-09-26 NOTE — Progress Notes (Signed)
  Gina Mccall - 75 y.o. female MRN [id number]  Date of birth: 01-14-1946  SUBJECTIVE:  Including CC & ROS.  No chief complaint on file.   Gina Mccall is a 75 y.o. female that is following up for her left foot fracture.  Pain is intermittent nature and seems to be worse at night at times.  Does have swelling still.  Continues to wear the boot.    Review of Systems See HPI   HISTORY: Past Medical, Surgical, Social, and Family History Reviewed & Updated per EMR.   Pertinent Historical Findings include:  Past Medical History:  Diagnosis Date   Chronic pain syndrome    Duodenal ulcer    GERD (gastroesophageal reflux disease)    H pylori ulcer 2017   hx of   Insomnia    MTHFR gene mutation    Occipital neuralgia due to fall   Osteopetrosis    Peripheral neuropathy    Tail bone pain tail bone removed 1965     Past Surgical History:  Procedure Laterality Date   CAUTERIZE INNER NOSE     HYSTEROSCOPY WITH D & C N/A 04/22/2018   Procedure: DILATATION & CURETTAGE/HYSTEROSCOPY;  Surgeon: Allie Bossier, MD;  Location: [facility];  Service: Gynecology;  Laterality: N/A;   KNEE SURGERY Left 1987   tail bone  removed 1965   VULVA /PERINEUM BIOPSY N/A 04/22/2018   Procedure: VULVAR BIOPSY;  Surgeon: Allie Bossier, MD;  Location: [facility];  Service: Gynecology;  Laterality: N/A;    Family History  Problem Relation Age of Onset   Cancer Father    Heart disease Father    Lung cancer Paternal Uncle     Social History   Socioeconomic History   Marital status: Married    Spouse name: Not on file   Number of children: Not on file   Years of education: Not on file   Highest education level: Not on file  Occupational History   Not on file  Tobacco Use   Smoking status: Never   Smokeless tobacco: Never  Vaping Use   Vaping Use: Never used  Substance and Sexual Activity   Alcohol use: Yes    Comment: occasional glass of wine   Drug use: No   Sexual  activity: Not on file  Other Topics Concern   Not on file  Social History Narrative   Not on file   Social Determinants of Health   Financial Resource Strain: Not on file  Food Insecurity: Not on file  Transportation Needs: Not on file  Physical Activity: Not on file  Stress: Not on file  Social Connections: Not on file  Intimate Partner Violence: Not on file     PHYSICAL EXAM:  VS: BP 110/78 (BP Location: Left Arm, Patient Position: Sitting, Cuff Size: Normal)   Ht 5\' 5"  (1.651 m)   Wt 130 lb (59 kg)   BMI 21.63 kg/m  Physical Exam Gen: NAD, alert, cooperative with exam, well-appearing MSK:  Left foot: Swelling and tenderness over the fifth metatarsal base. Normal strength resistance. Neurovascular intact     ASSESSMENT & PLAN:   Nondisplaced fracture of fifth metatarsal bone, left foot, subsequent encounter for fracture with routine healing Initial injury around 6/30.  Still having some ongoing swelling and pain. -Counseled on home exercise therapy and supportive care. -X-ray. -Follow-up in 4 weeks.

## 2020-09-27 ENCOUNTER — Telehealth: Payer: Self-pay | Admitting: Family Medicine

## 2020-09-27 NOTE — Telephone Encounter (Signed)
Patient called to check status of X-ray results--advised nothing release yet.  --Forwarding message to provider for Imaging review & pt contact.  --glh

## 2020-09-27 NOTE — Assessment & Plan Note (Signed)
Initial injury around 6/30.  Still having some ongoing swelling and pain. -Counseled on home exercise therapy and supportive care. -X-ray. -Follow-up in 4 weeks.

## 2020-09-30 NOTE — Telephone Encounter (Signed)
Informed of results.   Myra Rude, MD [facility] 09/30/2020, 1:02 PM

## 2020-10-25 ENCOUNTER — Ambulatory Visit: Payer: Medicare HMO | Admitting: Family Medicine

## 2020-10-25 ENCOUNTER — Ambulatory Visit (HOSPITAL_BASED_OUTPATIENT_CLINIC_OR_DEPARTMENT_OTHER)
Admission: RE | Admit: 2020-10-25 | Discharge: 2020-10-25 | Disposition: A | Discharge status: Home / Self Care | Payer: Medicare HMO | Source: Ambulatory Visit | Attending: Family Medicine | Admitting: Family Medicine

## 2020-10-25 ENCOUNTER — Encounter: Payer: Self-pay | Admitting: Family Medicine

## 2020-10-25 ENCOUNTER — Other Ambulatory Visit: Payer: Self-pay

## 2020-10-25 VITALS — Ht 65.0 in | Wt 130.0 lb

## 2020-10-25 DIAGNOSIS — S92355D Nondisplaced fracture of fifth metatarsal bone, left foot, subsequent encounter for fracture with routine healing: Secondary | ICD-10-CM | POA: Insufficient documentation

## 2020-10-25 NOTE — Assessment & Plan Note (Signed)
Initial injury around 6/30.  Able to bear weight in the clinic today with little to no pain.  Did note some swelling on exam and mild bruising. -Counseled on home exercise therapy and supportive care. -X-ray. -Follow-up in 4 weeks.

## 2020-10-25 NOTE — Progress Notes (Signed)
  Alicyn Klann - 75 y.o. female MRN [id number]  Date of birth: 1945/07/04  SUBJECTIVE:  Including CC & ROS.  No chief complaint on file.   Lyndon Chenoweth is a 75 y.o. female that is following up for her left foot fracture.  Has been using the boot.  Denies any significant pain but does have shocking sensations in the foot.   Review of Systems See HPI   HISTORY: Past Medical, Surgical, Social, and Family History Reviewed & Updated per EMR.   Pertinent Historical Findings include:  Past Medical History:  Diagnosis Date   Chronic pain syndrome    Duodenal ulcer    GERD (gastroesophageal reflux disease)    H pylori ulcer 2017   hx of   Insomnia    MTHFR gene mutation    Occipital neuralgia due to fall   Osteopetrosis    Peripheral neuropathy    Tail bone pain tail bone removed 1965     Past Surgical History:  Procedure Laterality Date   CAUTERIZE INNER NOSE     HYSTEROSCOPY WITH D & C N/A 04/22/2018   Procedure: DILATATION & CURETTAGE/HYSTEROSCOPY;  Surgeon: Allie Bossier, MD;  Location: [facility];  Service: Gynecology;  Laterality: N/A;   KNEE SURGERY Left 1987   tail bone  removed 1965   VULVA /PERINEUM BIOPSY N/A 04/22/2018   Procedure: VULVAR BIOPSY;  Surgeon: Allie Bossier, MD;  Location: [facility];  Service: Gynecology;  Laterality: N/A;    Family History  Problem Relation Age of Onset   Cancer Father    Heart disease Father    Lung cancer Paternal Uncle     Social History   Socioeconomic History   Marital status: Married    Spouse name: Not on file   Number of children: Not on file   Years of education: Not on file   Highest education level: Not on file  Occupational History   Not on file  Tobacco Use   Smoking status: Never   Smokeless tobacco: Never  Vaping Use   Vaping Use: Never used  Substance and Sexual Activity   Alcohol use: Yes    Comment: occasional glass of wine   Drug use: No   Sexual activity: Not on file   Other Topics Concern   Not on file  Social History Narrative   Not on file   Social Determinants of Health   Financial Resource Strain: Not on file  Food Insecurity: Not on file  Transportation Needs: Not on file  Physical Activity: Not on file  Stress: Not on file  Social Connections: Not on file  Intimate Partner Violence: Not on file     PHYSICAL EXAM:  VS: Ht 5\' 5"  (1.651 m)   Wt 130 lb (59 kg)   BMI 21.63 kg/m  Physical Exam Gen: NAD, alert, cooperative with exam, well-appearing       ASSESSMENT & PLAN:   Nondisplaced fracture of fifth metatarsal bone, left foot, subsequent encounter for fracture with routine healing Initial injury around 6/30.  Able to bear weight in the clinic today with little to no pain.  Did note some swelling on exam and mild bruising. -Counseled on home exercise therapy and supportive care. -X-ray. -Follow-up in 4 weeks.

## 2020-10-25 NOTE — Patient Instructions (Signed)
Good to see you I will call with the xray results.   Please send me a message in MyChart with any questions or updates.  Please see me back in 4 weeks.   --Dr. Jordan Likes

## 2020-10-29 ENCOUNTER — Telehealth: Payer: Self-pay | Admitting: Family Medicine

## 2020-10-29 NOTE — Telephone Encounter (Signed)
Informed of results.   Myra Rude, MD [facility] 10/29/2020, 8:22 AM

## 2020-11-26 ENCOUNTER — Ambulatory Visit (HOSPITAL_BASED_OUTPATIENT_CLINIC_OR_DEPARTMENT_OTHER)
Admission: RE | Admit: 2020-11-26 | Discharge: 2020-11-26 | Disposition: A | Discharge status: Home / Self Care | Payer: Medicare HMO | Source: Ambulatory Visit | Attending: Family Medicine | Admitting: Family Medicine

## 2020-11-26 ENCOUNTER — Other Ambulatory Visit: Payer: Self-pay

## 2020-11-26 ENCOUNTER — Encounter: Payer: Self-pay | Admitting: Family Medicine

## 2020-11-26 ENCOUNTER — Ambulatory Visit: Payer: Medicare HMO | Admitting: Family Medicine

## 2020-11-26 VITALS — Ht 65.0 in | Wt 130.0 lb

## 2020-11-26 DIAGNOSIS — S92355D Nondisplaced fracture of fifth metatarsal bone, left foot, subsequent encounter for fracture with routine healing: Secondary | ICD-10-CM | POA: Diagnosis present

## 2020-11-26 NOTE — Progress Notes (Signed)
  Aveena Bari - 75 y.o. female MRN [id number]  Date of birth: 27-Feb-1945  SUBJECTIVE:  Including CC & ROS.  No chief complaint on file.   Leane Loring is a 75 y.o. female that is following up for her right foot fracture.  No swelling today and no pain on palpation.    Review of Systems See HPI   HISTORY: Past Medical, Surgical, Social, and Family History Reviewed & Updated per EMR.   Pertinent Historical Findings include:  Past Medical History:  Diagnosis Date   Chronic pain syndrome    Duodenal ulcer    GERD (gastroesophageal reflux disease)    H pylori ulcer 2017   hx of   Insomnia    MTHFR gene mutation    Occipital neuralgia due to fall   Osteopetrosis    Peripheral neuropathy    Tail bone pain tail bone removed 1965     Past Surgical History:  Procedure Laterality Date   CAUTERIZE INNER NOSE     HYSTEROSCOPY WITH D & C N/A 04/22/2018   Procedure: DILATATION & CURETTAGE/HYSTEROSCOPY;  Surgeon: Allie Bossier, MD;  Location: [facility];  Service: Gynecology;  Laterality: N/A;   KNEE SURGERY Left 1987   tail bone  removed 1965   VULVA /PERINEUM BIOPSY N/A 04/22/2018   Procedure: VULVAR BIOPSY;  Surgeon: Allie Bossier, MD;  Location: [facility];  Service: Gynecology;  Laterality: N/A;    Family History  Problem Relation Age of Onset   Cancer Father    Heart disease Father    Lung cancer Paternal Uncle     Social History   Socioeconomic History   Marital status: Married    Spouse name: Not on file   Number of children: Not on file   Years of education: Not on file   Highest education level: Not on file  Occupational History   Not on file  Tobacco Use   Smoking status: Never   Smokeless tobacco: Never  Vaping Use   Vaping Use: Never used  Substance and Sexual Activity   Alcohol use: Yes    Comment: occasional glass of wine   Drug use: No   Sexual activity: Not on file  Other Topics Concern   Not on file  Social History  Narrative   Not on file   Social Determinants of Health   Financial Resource Strain: Not on file  Food Insecurity: Not on file  Transportation Needs: Not on file  Physical Activity: Not on file  Stress: Not on file  Social Connections: Not on file  Intimate Partner Violence: Not on file     PHYSICAL EXAM:  VS: Ht 5\' 5"  (1.651 m)   Wt 130 lb (59 kg)   BMI 21.63 kg/m  Physical Exam Gen: NAD, alert, cooperative with exam, well-appearing      ASSESSMENT & PLAN:   Nondisplaced fracture of fifth metatarsal bone, left foot, subsequent encounter for fracture with routine healing Initial injury around 6/30.  No pain on palpation today and no swelling. -Counseled on home exercise therapy and supportive care. -X-ray.

## 2020-11-26 NOTE — Patient Instructions (Signed)
Good to see you °I will call with the results from today   °Please send me a message in MyChart with any questions or updates.  °Please see me back as needed.  ° °--Dr. Marquin Patino ° °

## 2020-11-26 NOTE — Assessment & Plan Note (Signed)
Initial injury around 6/30.  No pain on palpation today and no swelling. -Counseled on home exercise therapy and supportive care. -X-ray.

## 2020-11-27 ENCOUNTER — Telehealth: Payer: Self-pay | Admitting: Family Medicine

## 2020-11-27 NOTE — Telephone Encounter (Signed)
Pt left msg request a call back she wants to know what % of healing her Fx has has between Office visits --pls call her @ [phone] -Forwarding request to provider.  -glh

## 2020-11-27 NOTE — Telephone Encounter (Signed)
Informed of results.   Myra Rude, MD [facility] 11/27/2020, 4:58 PM

## 2021-03-17 ENCOUNTER — Other Ambulatory Visit: Payer: Self-pay | Admitting: *Deleted

## 2021-03-17 DIAGNOSIS — M810 Age-related osteoporosis without current pathological fracture: Secondary | ICD-10-CM

## 2021-03-17 DIAGNOSIS — R9389 Abnormal findings on diagnostic imaging of other specified body structures: Secondary | ICD-10-CM

## 2021-04-21 ENCOUNTER — Other Ambulatory Visit: Payer: Medicare HMO

## 2021-04-23 ENCOUNTER — Ambulatory Visit: Payer: Medicare HMO

## 2021-04-23 ENCOUNTER — Other Ambulatory Visit: Payer: Self-pay

## 2021-04-23 ENCOUNTER — Ambulatory Visit (INDEPENDENT_AMBULATORY_CARE_PROVIDER_SITE_OTHER): Payer: Medicare HMO

## 2021-04-23 DIAGNOSIS — Z780 Asymptomatic menopausal state: Secondary | ICD-10-CM | POA: Diagnosis not present

## 2021-04-23 DIAGNOSIS — R9389 Abnormal findings on diagnostic imaging of other specified body structures: Secondary | ICD-10-CM

## 2021-04-28 ENCOUNTER — Ambulatory Visit: Payer: Medicare HMO | Admitting: Obstetrics and Gynecology

## 2021-05-02 ENCOUNTER — Telehealth: Payer: Self-pay | Admitting: *Deleted

## 2021-05-02 NOTE — Telephone Encounter (Signed)
Returned call from 8:05 AM. Left patient a message to call and try to reschedule 06/05/2021 appointment for a date in March per patient request. ?

## 2021-06-05 ENCOUNTER — Ambulatory Visit (INDEPENDENT_AMBULATORY_CARE_PROVIDER_SITE_OTHER): Payer: Medicare HMO | Admitting: Obstetrics and Gynecology

## 2021-06-05 ENCOUNTER — Encounter: Payer: Self-pay | Admitting: Obstetrics and Gynecology

## 2021-06-05 VITALS — BP 102/67 | HR 76 | Ht 65.0 in | Wt 139.0 lb

## 2021-06-05 DIAGNOSIS — M810 Age-related osteoporosis without current pathological fracture: Secondary | ICD-10-CM | POA: Diagnosis not present

## 2021-06-05 DIAGNOSIS — Z1231 Encounter for screening mammogram for malignant neoplasm of breast: Secondary | ICD-10-CM | POA: Diagnosis not present

## 2021-06-05 DIAGNOSIS — Z79890 Hormone replacement therapy (postmenopausal): Secondary | ICD-10-CM

## 2021-06-05 DIAGNOSIS — Z01419 Encounter for gynecological examination (general) (routine) without abnormal findings: Secondary | ICD-10-CM

## 2021-06-05 MED ORDER — PROGESTERONE MICRONIZED POWD: 6 refills | Status: DC

## 2021-06-05 MED ORDER — ESTRADIOL 2 MG VA RING: 2.0000 mg | VAGINAL_RING | VAGINAL | 12 refills | Status: DC

## 2021-06-05 MED ORDER — ESTRADIOL 0.025 MG/24HR TD PTTW: MEDICATED_PATCH | TRANSDERMAL | 12 refills | Status: DC

## 2021-06-05 NOTE — Progress Notes (Signed)
? ? ?GYNECOLOGY ANNUAL PREVENTATIVE CARE ENCOUNTER NOTE ? ?Subjective:  ? Gina Mccall is a 76 y.o. [phone] female here for a annual gynecologic exam. 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  She is on HRT and doing well, reports her symptoms are well controlled. Urinary issues much improved with E-string. Does use a Vivelle dot 1/2 dose to good effect and she would like to continue. ? ?Denies abnormal vaginal bleeding, discharge, pelvic pain, problems with intercourse or other gynecologic concerns. Declines STI screen. ?  ?Gynecologic History ?No LMP recorded. Patient is postmenopausal. ?Contraception: post menopausal status ?Last Pap: n/a > age 16 ?Last mammogram: 05/2020. Results: Birads 2 ?DEXA: has had ? ?Obstetric History ?OB History  ?Gravida Para Term Preterm AB Living  ?4 3 3   1 3   ?SAB IAB Ectopic Multiple Live Births  ?1          ?  ?# Outcome Date GA Lbr Len/2nd Weight Sex Delivery Anes PTL Lv  ?4 SAB           ?3 Term      Vag-Spont     ?2 Term      Vag-Spont     ?1 Term      Vag-Spont     ? ? ?Past Medical History:  ?Diagnosis Date  ? Chronic pain syndrome   ? Duodenal ulcer   ? GERD (gastroesophageal reflux disease)   ? H pylori ulcer 2017  ? hx of  ? Insomnia   ? MTHFR gene mutation   ? Occipital neuralgia due to fall  ? Osteopetrosis   ? Peripheral neuropathy   ? Tail bone pain tail bone removed 1965   ? ? ?Past Surgical History:  ?Procedure Laterality Date  ? CAUTERIZE INNER NOSE    ? HYSTEROSCOPY WITH D & C N/A 04/22/2018  ? Procedure: DILATATION & CURETTAGE/HYSTEROSCOPY;  Surgeon: 04/24/2018, MD;  Location: [facility];  Service: Gynecology;  Laterality: N/A;  ? KNEE SURGERY Left 1987  ? tail bone  removed 1965  ? VULVA /PERINEUM BIOPSY N/A 04/22/2018  ? Procedure: VULVAR BIOPSY;  Surgeon: 04/24/2018, MD;  Location: [facility];  Service: Gynecology;  Laterality: N/A;  ? ? ?Current Outpatient Medications on File Prior to  Visit  ?Medication Sig Dispense Refill  ? ASHWAGANDHA PO Take by mouth.    ? BIOTIN PO Take by mouth.    ? Calcium-Magnesium-Vitamin D 500-250-200 MG-MG-UNIT TABS Take by mouth.    ? clonazePAM (KLONOPIN) 0.5 MG tablet TAKE 1 TABLET (0.5 MG TOTAL) BY MOUTH AT BEDTIME AS NEEDED FOR ANXIETY.    ? MAGNESIUM PO Take by mouth.    ? Omega-3 Fatty Acids (FISH OIL) 1000 MG CAPS Take 1 capsule by mouth daily.    ? VITAMIN E PO Take by mouth.    ? VITAMIN K PO Take by mouth.    ? zolpidem (AMBIEN) 10 MG tablet Take 10 mg by mouth at bedtime as needed.    ? bismuth subsalicylate (PEPTO BISMOL) 262 MG/15ML suspension Take by mouth. (Patient not taking: Reported on 06/05/2021)    ? calcium elemental as carbonate (BARIATRIC TUMS ULTRA) 400 MG chewable tablet Chew by mouth. (Patient not taking: Reported on 06/05/2021)    ? L-Methylfolate-Algae-B12-B6 (METANX) 3-90.314-2-35 MG CAPS Take 1 capsule by mouth daily. (Patient not taking: Reported on 06/05/2021)    ? Misc. Devices MISC Massage therapy twice a week for cervical stenosis  and occipital neuralgia (Patient not taking: Reported on 06/05/2021)    ? ?No current facility-administered medications on file prior to visit.  ? ? ?Allergies  ?Allergen Reactions  ? Clindamycin/Lincomycin Nausea And Vomiting, Anaphylaxis and Other (See Comments)  ? Sulfa Antibiotics Other (See Comments) and Rash  ?  Bruising ? ?Bruising ?  ? Erythromycin Nausea And Vomiting  ? Erythromycin Base   ? Sulfonamide Derivatives   ? ? ?Social History  ? ?Socioeconomic History  ? Marital status: Married  ?  Spouse name: Not on file  ? Number of children: Not on file  ? Years of education: Not on file  ? Highest education level: Not on file  ?Occupational History  ? Not on file  ?Tobacco Use  ? Smoking status: Never  ? Smokeless tobacco: Never  ?Vaping Use  ? Vaping Use: Never used  ?Substance and Sexual Activity  ? Alcohol use: Yes  ?  Comment: occasional glass of wine  ? Drug use: No  ? Sexual activity: Not on file   ?Other Topics Concern  ? Not on file  ?Social History Narrative  ? Not on file  ? ?Social Determinants of Health  ? ?Financial Resource Strain: Not on file  ?Food Insecurity: Not on file  ?Transportation Needs: Not on file  ?Physical Activity: Not on file  ?Stress: Not on file  ?Social Connections: Not on file  ?Intimate Partner Violence: Not on file  ? ? ?Family History  ?Problem Relation Age of Onset  ? Cancer Father   ? Heart disease Father   ? Lung cancer Paternal Uncle   ? ? ?The following portions of the patient's history were reviewed and updated as appropriate: allergies, current medications, past family history, past medical history, past social history, past surgical history and problem list. ? ?Review of Systems ?Pertinent items are noted in HPI. ?  ?Objective:  ?BP 102/67   Pulse 76   Ht 5\' 5"  (1.651 m)   Wt 139 lb (63 kg)   BMI 23.13 kg/m?  ?CONSTITUTIONAL: Well-developed, well-nourished female in no acute distress.  ?HENT:  Normocephalic, atraumatic, External right and left ear normal. Oropharynx is clear and moist ?EYES: Conjunctivae and EOM are normal. Pupils are equal, round, and reactive to light. No scleral icterus.  ?NECK: Normal range of motion, supple, no masses.  Normal thyroid.  ?SKIN: Skin is warm and dry. No rash noted. Not diaphoretic. No erythema. No pallor. ?NEUROLOGIC: Alert and oriented to person, place, and time. Normal reflexes, muscle tone coordination. No cranial nerve deficit noted. ?PSYCHIATRIC: Normal mood and affect. Normal behavior. Normal judgment and thought content. ?CARDIOVASCULAR: Normal heart rate noted ?RESPIRATORY: Effort normal, no problems with respiration noted. ?BREASTS: Symmetric in size. No masses, skin changes, nipple drainage, or lymphadenopathy. ?ABDOMEN: Soft, no distention noted.  No tenderness, rebound or guarding.  ?PELVIC: mildly atrophic appearing external genitalia; normal appearing vaginal mucosa and cervix.  No abnormal discharge noted.  Normal  uterine size, no other palpable masses, no uterine or adnexal tenderness. ?MUSCULOSKELETAL: Normal range of motion. No tenderness.  No cyanosis, clubbing, or edema.  ? ?Exam done with chaperone present. ? ? ?Assessment and Plan:  ? ?1. Well woman exam ?Healthy female exam ? ?2. Encounter for screening mammogram for malignant neoplasm of breast ?- MM Digital Screening; Future ? ?3. Hormone replacement therapy (HRT) ?Reviewed risks of continuing HRT including increased risk VTE, reviewed recommendation for shortest duration of therapy at lowest dose tolerated, patient verblizes understanding of these risks  and would like to continue as her symptoms are well controlled on HRT ? ?4. Age related osteoporosis, unspecified pathological fracture presence ?Patient seeing primary care for management ?She is taking calcium ? ? ?COVID vaccine  ?Declines STI screen. ?Mammogram ordered ?Referral for colonoscopy n/a  ?Flu vaccine deferred ?DEXA not due ? ?Routine preventative health maintenance measures emphasized. ?Please refer to After Visit Summary for other counseling recommendations.  ? ? ?K. Therese SarahMeryl Kendarrius Tanzi, MD, FACOG ?Attending ?[facility](Faculty Practice) ? ?

## 2021-06-06 ENCOUNTER — Other Ambulatory Visit: Payer: Self-pay | Admitting: Obstetrics and Gynecology

## 2021-08-14 ENCOUNTER — Ambulatory Visit: Payer: Medicare HMO

## 2021-09-03 ENCOUNTER — Other Ambulatory Visit: Payer: Self-pay | Admitting: Radiology

## 2021-09-11 ENCOUNTER — Ambulatory Visit (INDEPENDENT_AMBULATORY_CARE_PROVIDER_SITE_OTHER): Payer: Medicare HMO

## 2021-09-11 DIAGNOSIS — Z1231 Encounter for screening mammogram for malignant neoplasm of breast: Secondary | ICD-10-CM | POA: Diagnosis not present

## 2021-09-18 ENCOUNTER — Ambulatory Visit: Payer: Medicare HMO

## 2021-10-02 ENCOUNTER — Ambulatory Visit: Payer: Medicare HMO

## 2021-11-06 ENCOUNTER — Ambulatory Visit: Payer: Medicare HMO | Admitting: Obstetrics and Gynecology

## 2021-11-06 ENCOUNTER — Encounter: Payer: Self-pay | Admitting: Obstetrics and Gynecology

## 2021-11-06 VITALS — BP 133/83 | HR 73 | Resp 16 | Ht 65.0 in | Wt 140.0 lb

## 2021-11-06 DIAGNOSIS — R350 Frequency of micturition: Secondary | ICD-10-CM

## 2021-11-06 DIAGNOSIS — N9089 Other specified noninflammatory disorders of vulva and perineum: Secondary | ICD-10-CM | POA: Diagnosis not present

## 2021-11-06 LAB — POCT URINALYSIS DIPSTICK
Appearance: NORMAL
Bilirubin, UA: NEGATIVE
Blood, UA: NEGATIVE
Glucose, UA: NEGATIVE
Ketones, UA: NEGATIVE
Leukocytes, UA: NEGATIVE
Nitrite, UA: NEGATIVE
Protein, UA: NEGATIVE
Spec Grav, UA: <=1.005 — AB (ref 1.010–1.025)
Urobilinogen, UA: NEGATIVE E.U./dL — AB
pH, UA: 7 (ref 5.0–8.0)

## 2021-11-06 NOTE — Progress Notes (Signed)
Pt wanted a urine dip because she thought her urine looked cloudy.  Urine is canary yellow and clear

## 2021-11-06 NOTE — Progress Notes (Signed)
GYNECOLOGY OFFICE VISIT NOTE  History:   Gina Mccall is a 76 y.o. [phone] here today for vulvar lump present for 2-3 weeks. She monitors her vulva due to precautions reviewed with Dr. Marice Potter. She notes the lump is about 37mm, deep and is very mobile.   She also noted some urinary changes consisting of urinary frequency and cloudy urine. She had a urine culture some months ago at her PCP's office and it was negative for infection.   She denies any abnormal vaginal discharge, bleeding, pelvic pain or other concerns.     Past Medical History:  Diagnosis Date   Chronic pain syndrome    Duodenal ulcer    GERD (gastroesophageal reflux disease)    H pylori ulcer 2017   hx of   Insomnia    MTHFR gene mutation    Occipital neuralgia due to fall   Osteopetrosis    Peripheral neuropathy    Tail bone pain tail bone removed 1965     Past Surgical History:  Procedure Laterality Date   CAUTERIZE INNER NOSE     HYSTEROSCOPY WITH D & C N/A 04/22/2018   Procedure: DILATATION & CURETTAGE/HYSTEROSCOPY;  Surgeon: Allie Bossier, MD;  Location: [facility];  Service: Gynecology;  Laterality: N/A;   KNEE SURGERY Left 1987   tail bone  removed 1965   VULVA /PERINEUM BIOPSY N/A 04/22/2018   Procedure: VULVAR BIOPSY;  Surgeon: Allie Bossier, MD;  Location: [facility];  Service: Gynecology;  Laterality: N/A;    The following portions of the patient's history were reviewed and updated as appropriate: allergies, current medications, past family history, past medical history, past social history, past surgical history and problem list.   Health Maintenance:   Paps no longer required.  Mammo 08/2021  Review of Systems:  Pertinent items noted in HPI and remainder of comprehensive ROS otherwise negative.  Physical Exam:  BP 133/83   Pulse 73   Resp 16   Ht 5\' 5"  (1.651 m)   Wt 140 lb (63.5 kg)   BMI 23.30 kg/m  CONSTITUTIONAL: Well-developed, well-nourished female in no  acute distress.  HEENT:  Normocephalic, atraumatic. External right and left ear normal. No scleral icterus.  NECK: Normal range of motion, supple, no masses noted on observation SKIN: No rash noted. Not diaphoretic. No erythema. No pallor. MUSCULOSKELETAL: Normal range of motion. No edema noted. NEUROLOGIC: Alert and oriented to person, place, and time. Normal muscle tone coordination. No cranial nerve deficit noted. PSYCHIATRIC: Normal mood and affect. Normal behavior. Normal judgment and thought content.  CARDIOVASCULAR: Normal heart rate noted RESPIRATORY: Effort and breath sounds normal, no problems with respiration noted ABDOMEN: No masses noted. No other overt distention noted.    PELVIC: Normal appearing external genitalia; Small mobile lump in the right labia majora that is nontender. It was about 5 mm in size. Performed in the presence of a chaperone  Labs and Imaging Results for orders placed or performed in visit on 11/06/21 (from the past 168 hour(s))  POCT Urinalysis Dipstick   Collection Time: 11/06/21  9:06 AM  Result Value Ref Range   Color, UA yellow    Clarity, UA clear    Glucose, UA Negative Negative   Bilirubin, UA neg    Ketones, UA neg    Spec Grav, UA <=1.005 (A) 1.010 - 1.025   Blood, UA neg    pH, UA 7.0 5.0 - 8.0   Protein, UA Negative Negative  Urobilinogen, UA negative (A) 0.2 or 1.0 E.U./dL   Nitrite, UA neg    Leukocytes, UA Negative Negative   Appearance normal    Odor none    No results found.  Assessment and Plan:   1. Urinary frequency - POCT Urinalysis Dipstick  2. Vulvar lump - Small mobile lump in the right labia majora that is nontender. No skin changes overlying it.  - Reviewed given the exam findings, the depth of it, and the fact it has not grown, I would recommend observing it.  - We discussed if it grows, can check imaging (likely a CT) but at this point, I don't think the radiation would be wise.  - She agrees with plan and will  do monthly checks on it.    Routine preventative health maintenance measures emphasized. Please refer to After Visit Summary for other counseling recommendations.   No follow-ups on file.  Milas Hock, MD, FACOG Obstetrician & Gynecologist, [facility] for [facility], [facility]

## 2021-11-13 ENCOUNTER — Ambulatory Visit: Payer: Medicare HMO | Admitting: Obstetrics and Gynecology

## 2022-03-03 ENCOUNTER — Other Ambulatory Visit: Payer: Self-pay | Admitting: Obstetrics and Gynecology

## 2022-03-03 DIAGNOSIS — M810 Age-related osteoporosis without current pathological fracture: Secondary | ICD-10-CM

## 2022-03-04 ENCOUNTER — Other Ambulatory Visit: Payer: Self-pay | Admitting: *Deleted

## 2022-03-04 DIAGNOSIS — R9389 Abnormal findings on diagnostic imaging of other specified body structures: Secondary | ICD-10-CM

## 2022-05-13 ENCOUNTER — Ambulatory Visit (INDEPENDENT_AMBULATORY_CARE_PROVIDER_SITE_OTHER): Payer: Medicare HMO

## 2022-05-13 DIAGNOSIS — M810 Age-related osteoporosis without current pathological fracture: Secondary | ICD-10-CM

## 2022-05-13 DIAGNOSIS — Z79890 Hormone replacement therapy (postmenopausal): Secondary | ICD-10-CM

## 2022-05-13 DIAGNOSIS — R9389 Abnormal findings on diagnostic imaging of other specified body structures: Secondary | ICD-10-CM

## 2022-05-13 DIAGNOSIS — Z780 Asymptomatic menopausal state: Secondary | ICD-10-CM

## 2022-06-08 ENCOUNTER — Encounter: Payer: Self-pay | Admitting: *Deleted

## 2022-06-10 ENCOUNTER — Ambulatory Visit: Payer: Medicare HMO | Admitting: Obstetrics and Gynecology

## 2022-06-21 ENCOUNTER — Other Ambulatory Visit: Payer: Self-pay | Admitting: Obstetrics and Gynecology

## 2022-08-06 ENCOUNTER — Ambulatory Visit: Payer: Medicare HMO | Admitting: Obstetrics and Gynecology

## 2023-04-17 ENCOUNTER — Other Ambulatory Visit: Payer: Self-pay | Admitting: Family Medicine

## 2023-04-26 ENCOUNTER — Other Ambulatory Visit: Payer: Self-pay | Admitting: *Deleted

## 2023-04-26 DIAGNOSIS — Z139 Encounter for screening, unspecified: Secondary | ICD-10-CM

## 2023-05-20 ENCOUNTER — Ambulatory Visit

## 2023-05-20 DIAGNOSIS — Z1231 Encounter for screening mammogram for malignant neoplasm of breast: Secondary | ICD-10-CM

## 2023-05-20 DIAGNOSIS — Z139 Encounter for screening, unspecified: Secondary | ICD-10-CM

## 2023-05-24 ENCOUNTER — Encounter: Payer: Self-pay | Admitting: Obstetrics and Gynecology

## 2023-06-03 ENCOUNTER — Encounter: Payer: Self-pay | Admitting: Obstetrics and Gynecology

## 2023-06-07 ENCOUNTER — Ambulatory Visit: Admitting: Obstetrics and Gynecology

## 2023-06-07 ENCOUNTER — Encounter: Payer: Self-pay | Admitting: Obstetrics and Gynecology

## 2023-06-07 VITALS — BP 120/73 | HR 74 | Wt 139.0 lb

## 2023-06-07 DIAGNOSIS — Z01419 Encounter for gynecological examination (general) (routine) without abnormal findings: Secondary | ICD-10-CM | POA: Diagnosis not present

## 2023-06-07 NOTE — Progress Notes (Signed)
   ANNUAL EXAM Patient name: Gina Mccall MRN [id number]  Date of birth: [birth year] Chief Complaint:   No chief complaint on file.  History of Present Illness:   Gina Mccall is a 78 y.o. menopausal [phone] being seen today for a routine annual exam.  Current complaints:   Episodes of dizziness/lightheadedness. Symptoms riggered by caffeine. Has seen cardiology and is going to see neurology next.   Last pap >5 years ago. No history of abnormals. No longer screening.  Last mammogram: 05/20/23. Results were: normal. Last colonoscopy: unsure, follows with [facility] with PCP      No data to display               No data to display           Review of Systems:   Pertinent items are noted in HPI Denies any headaches, blurred vision, fatigue, shortness of breath, chest pain, abdominal pain, abnormal vaginal discharge/itching/odor/irritation, problems with periods, bowel movements, urination, or intercourse unless otherwise stated above. Pertinent History Reviewed:  Reviewed past medical,surgical, social and family history.  Reviewed problem list, medications and allergies. Physical Assessment:   Vitals:   06/07/23 1104  BP: 120/73  Pulse: 74  Weight: 139 lb (63 kg)  Body mass index is 23.13 kg/m.        Physical Examination:   General appearance - well appearing, and in no distress  Mental status - alert, oriented to person, place, and time  Chest - respiratory effort normal  Heart - normal peripheral perfusion  Breasts - breasts appear normal, no suspicious masses, no skin or nipple changes or axillary nodes  Abdomen - soft, nontender, nondistended, no masses or organomegaly  Pelvic - VULVA: normal appearing vulva with no masses, tenderness or lesions  VAGINA: normal appearing vagina with normal color and discharge, no lesions  CERVIX: no CMT no cervical mass  UTERUS: uterus is felt to be normal size, shape, consistency and nontender   ADNEXA: No adnexal masses or  tenderness noted.  Chaperone present for exam  No results found for this or any previous visit (from the past 24 hours).  Assessment & Plan:  1) Well-Woman Exam Mammogram: in 1 year, or sooner if problems Colonoscopy:  per PCP Pap: No longer screening (no hx abnormals, age 36+) - Recommended discontinuing systemic hormone therapy esp if she is only using it for bone density management. Discussed that there are other first line options for management that are safer. Reviewed elevated CVA/CVD risks esp in women in their 56s. She is agreeable to this plan.  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  Izell Marsh, MD 06/07/2023 1:01 PM

## 2023-06-07 NOTE — Patient Instructions (Signed)
 It was nice meeting you today! Please stop your estrogen patch & progesterone pills. Keep using the estring.   Please let us  know if you have any questions/concerns. If no issues we will see you back in 1 year.

## 2023-10-07 ENCOUNTER — Encounter: Payer: Self-pay | Admitting: Obstetrics and Gynecology
